# Patient Record
Sex: Female | Born: 1960 | Race: White | Hispanic: No | Marital: Married | Smoking: Never smoker
Health system: Southern US, Community
[De-identification: ages and names within clinical notes are randomized; demographics above are authoritative.]

## PROBLEM LIST (undated history)

## (undated) DIAGNOSIS — M7501 Adhesive capsulitis of right shoulder: Secondary | ICD-10-CM

## (undated) DIAGNOSIS — M1990 Unspecified osteoarthritis, unspecified site: Secondary | ICD-10-CM

## (undated) DIAGNOSIS — F329 Major depressive disorder, single episode, unspecified: Secondary | ICD-10-CM

## (undated) DIAGNOSIS — F32A Depression, unspecified: Secondary | ICD-10-CM

## (undated) HISTORY — PX: ABDOMINAL HYSTERECTOMY: SHX81

## (undated) HISTORY — PX: SHOULDER ARTHROSCOPY: SHX128

## (undated) HISTORY — PX: VOCAL CORD INJECTION: SHX2663

## (undated) HISTORY — PX: TONSILLECTOMY: SUR1361

---

## 2018-03-28 DIAGNOSIS — M25511 Pain in right shoulder: Secondary | ICD-10-CM | POA: Diagnosis not present

## 2018-03-30 DIAGNOSIS — M7501 Adhesive capsulitis of right shoulder: Secondary | ICD-10-CM | POA: Diagnosis not present

## 2018-04-02 DIAGNOSIS — M7501 Adhesive capsulitis of right shoulder: Secondary | ICD-10-CM | POA: Diagnosis not present

## 2018-04-06 DIAGNOSIS — M7501 Adhesive capsulitis of right shoulder: Secondary | ICD-10-CM | POA: Diagnosis not present

## 2018-04-10 DIAGNOSIS — M7501 Adhesive capsulitis of right shoulder: Secondary | ICD-10-CM | POA: Diagnosis not present

## 2018-04-12 DIAGNOSIS — M7501 Adhesive capsulitis of right shoulder: Secondary | ICD-10-CM | POA: Diagnosis not present

## 2018-04-16 DIAGNOSIS — M7501 Adhesive capsulitis of right shoulder: Secondary | ICD-10-CM | POA: Diagnosis not present

## 2018-04-19 DIAGNOSIS — M7501 Adhesive capsulitis of right shoulder: Secondary | ICD-10-CM | POA: Diagnosis not present

## 2018-04-23 DIAGNOSIS — M7501 Adhesive capsulitis of right shoulder: Secondary | ICD-10-CM | POA: Diagnosis not present

## 2018-04-25 DIAGNOSIS — M7501 Adhesive capsulitis of right shoulder: Secondary | ICD-10-CM | POA: Diagnosis not present

## 2018-04-30 ENCOUNTER — Other Ambulatory Visit: Payer: Self-pay | Admitting: Orthopaedic Surgery

## 2018-04-30 ENCOUNTER — Encounter (HOSPITAL_BASED_OUTPATIENT_CLINIC_OR_DEPARTMENT_OTHER): Payer: Self-pay | Admitting: *Deleted

## 2018-04-30 ENCOUNTER — Other Ambulatory Visit: Payer: Self-pay

## 2018-04-30 DIAGNOSIS — M25511 Pain in right shoulder: Secondary | ICD-10-CM | POA: Diagnosis not present

## 2018-05-03 NOTE — H&P (Signed)
Whitney Cruz is an 57 y.o. female.   Chief Complaint: Right shoulder pain HPI: Whitney Cruz persists with some terrible shoulder difficulty.  It is not as painful as it was but it is no less stiff.  She has been injected on 2 occasions.  She had a similar problem with the opposite shoulder which responded well to surgical intervention.  A lot of her pain is on the anterior aspect of the right shoulder.  She has trouble resting at night.   Imaging/Tests: I reviewed an MRI scan films and report of a study done on the right shoulder [place]/22/[place].  This shows some tendinosis and possibly a full thickness cuff tear.  There is also a split tear of the biceps tendon.  Past Medical History:  Diagnosis Date  . Adhesive capsulitis of right shoulder   . Arthritis    hands  . Depression    hormone related    Past Surgical History:  Procedure Laterality Date  . ABDOMINAL HYSTERECTOMY    . SHOULDER ARTHROSCOPY Left   . TONSILLECTOMY    . VOCAL CORD INJECTION      History reviewed. No pertinent family history. Social History:  reports that she has never smoked. She has never used smokeless tobacco. She reports that she drinks alcohol. She reports that she does not use drugs.  Allergies: No Known Allergies  No medications prior to admission.    No results found for this or any previous visit (from the past 48 hour(s)). No results found.  Review of Systems  Musculoskeletal:       Right shoulder pain  All other systems reviewed and are negative.   Height 5\' 3"  (1.6 m), weight 61.2 kg (135 lb). Physical Exam  Constitutional: She is oriented to person, place, and time. She appears well-developed and well-nourished.  HENT:  Head: Normocephalic and atraumatic.  Eyes: Pupils are equal, round, and reactive to light. Conjunctivae are normal.  Neck: Normal range of motion.  Cardiovascular: Normal rate and regular rhythm.  Respiratory: Effort normal.  GI: Soft.  Musculoskeletal:  Right  shoulder remains stiff at about 90 of forward flexion with external rotation to maybe 5 and internal rotation to her back pocket.  She has pain at extremes.  I do not get much pain to palpation through the bicipital groove.  Sensation and motor function are intact distally.  She has good cervical motion which causes no arm pain.   Neurological: She is alert and oriented to person, place, and time.  Skin: Skin is warm and dry.  Psychiatric: She has a normal mood and affect. Her behavior is normal. Judgment and thought content normal.    Assessment/Plan Assessment: Right shoulder adhesive capsulitis with MRI 2019 injected 2  Plan: Whitney Cruz is headed towards an arthroscopy with manipulation.  By her MRI scan she does have a split tear of the biceps and perhaps a small cuff tear and we need to be ready to address both of those but my hope is that neither will require anything of significance.  We will perform a conservative acromioplasty and release adhesions and do the manipulation.  If the biceps does look truly torn I would probably do a release.  I would have a fairly high threshold for fixing a small rotator cuff tear as this would interfere with her postoperative therapy and ability to perform immediate range of motion.  I reviewed risk of anesthesia and infection as well as the importance of postoperative therapy  with her in some detail.  [name], Ginger Organ, PA-C 05/03/2018, 3:44 PM

## 2018-05-04 ENCOUNTER — Ambulatory Visit (HOSPITAL_BASED_OUTPATIENT_CLINIC_OR_DEPARTMENT_OTHER): Payer: Federal, State, Local not specified - PPO | Admitting: Anesthesiology

## 2018-05-04 ENCOUNTER — Other Ambulatory Visit: Payer: Self-pay

## 2018-05-04 ENCOUNTER — Ambulatory Visit (HOSPITAL_BASED_OUTPATIENT_CLINIC_OR_DEPARTMENT_OTHER)
Admission: RE | Admit: 2018-05-04 | Discharge: 2018-05-04 | Disposition: A | Discharge status: Home / Self Care | Payer: Federal, State, Local not specified - PPO | Source: Ambulatory Visit | Attending: Orthopaedic Surgery | Admitting: Orthopaedic Surgery

## 2018-05-04 ENCOUNTER — Encounter (HOSPITAL_BASED_OUTPATIENT_CLINIC_OR_DEPARTMENT_OTHER): Payer: Self-pay | Admitting: *Deleted

## 2018-05-04 ENCOUNTER — Encounter (HOSPITAL_BASED_OUTPATIENT_CLINIC_OR_DEPARTMENT_OTHER)
Admission: RE | Disposition: A | Discharge status: Home / Self Care | Payer: Self-pay | Source: Ambulatory Visit | Attending: Orthopaedic Surgery

## 2018-05-04 DIAGNOSIS — M7541 Impingement syndrome of right shoulder: Secondary | ICD-10-CM | POA: Diagnosis not present

## 2018-05-04 DIAGNOSIS — M7501 Adhesive capsulitis of right shoulder: Secondary | ICD-10-CM | POA: Diagnosis not present

## 2018-05-04 DIAGNOSIS — M7551 Bursitis of right shoulder: Secondary | ICD-10-CM | POA: Insufficient documentation

## 2018-05-04 DIAGNOSIS — M19041 Primary osteoarthritis, right hand: Secondary | ICD-10-CM | POA: Insufficient documentation

## 2018-05-04 DIAGNOSIS — M19042 Primary osteoarthritis, left hand: Secondary | ICD-10-CM | POA: Diagnosis not present

## 2018-05-04 DIAGNOSIS — M75111 Incomplete rotator cuff tear or rupture of right shoulder, not specified as traumatic: Secondary | ICD-10-CM | POA: Insufficient documentation

## 2018-05-04 DIAGNOSIS — M75101 Unspecified rotator cuff tear or rupture of right shoulder, not specified as traumatic: Secondary | ICD-10-CM | POA: Diagnosis not present

## 2018-05-04 DIAGNOSIS — G8918 Other acute postprocedural pain: Secondary | ICD-10-CM | POA: Diagnosis not present

## 2018-05-04 HISTORY — PX: SHOULDER ARTHROSCOPY: SHX128

## 2018-05-04 HISTORY — DX: Adhesive capsulitis of right shoulder: M75.01

## 2018-05-04 HISTORY — DX: Depression, unspecified: F32.A

## 2018-05-04 HISTORY — DX: Unspecified osteoarthritis, unspecified site: M19.90

## 2018-05-04 HISTORY — DX: Major depressive disorder, single episode, unspecified: F32.9

## 2018-05-04 SURGERY — ARTHROSCOPY, SHOULDER
Anesthesia: Regional | Site: Shoulder | Laterality: Right

## 2018-05-04 MED ORDER — LIDOCAINE HCL (CARDIAC) PF 100 MG/5ML IV SOSY
PREFILLED_SYRINGE | INTRAVENOUS | Status: AC
  Filled 2018-05-04: qty 5

## 2018-05-04 MED ORDER — LACTATED RINGERS IV SOLN
INTRAVENOUS | Status: DC
  Administered 2018-05-04: 11:00:00 via INTRAVENOUS

## 2018-05-04 MED ORDER — DEXAMETHASONE SODIUM PHOSPHATE 10 MG/ML IJ SOLN
INTRAMUSCULAR | Status: AC
  Filled 2018-05-04: qty 1

## 2018-05-04 MED ORDER — MIDAZOLAM HCL 2 MG/2ML IJ SOLN
1.0000 mg | INTRAMUSCULAR | Status: DC | PRN
  Administered 2018-05-04: 2 mg via INTRAVENOUS

## 2018-05-04 MED ORDER — DEXAMETHASONE SODIUM PHOSPHATE 10 MG/ML IJ SOLN
INTRAMUSCULAR | Status: DC | PRN
  Administered 2018-05-04: 10 mg via INTRAVENOUS

## 2018-05-04 MED ORDER — SODIUM CHLORIDE 0.9 % IR SOLN
Status: DC | PRN
  Administered 2018-05-04: 6000 mL

## 2018-05-04 MED ORDER — LACTATED RINGERS IV SOLN: INTRAVENOUS | Status: DC

## 2018-05-04 MED ORDER — SCOPOLAMINE 1 MG/3DAYS TD PT72: 1.0000 | MEDICATED_PATCH | Freq: Once | TRANSDERMAL | Status: DC | PRN

## 2018-05-04 MED ORDER — PHENYLEPHRINE 40 MCG/ML (10ML) SYRINGE FOR IV PUSH (FOR BLOOD PRESSURE SUPPORT)
PREFILLED_SYRINGE | INTRAVENOUS | Status: DC | PRN
  Administered 2018-05-04: 80 ug via INTRAVENOUS
  Administered 2018-05-04: 120 ug via INTRAVENOUS
  Administered 2018-05-04 (×3): 80 ug via INTRAVENOUS
  Administered 2018-05-04: 40 ug via INTRAVENOUS
  Administered 2018-05-04 (×2): 80 ug via INTRAVENOUS

## 2018-05-04 MED ORDER — MIDAZOLAM HCL 2 MG/2ML IJ SOLN
INTRAMUSCULAR | Status: AC
  Filled 2018-05-04: qty 2

## 2018-05-04 MED ORDER — EPHEDRINE SULFATE 50 MG/ML IJ SOLN
INTRAMUSCULAR | Status: AC
  Filled 2018-05-04: qty 1

## 2018-05-04 MED ORDER — PROPOFOL 10 MG/ML IV BOLUS
INTRAVENOUS | Status: AC
  Filled 2018-05-04: qty 20

## 2018-05-04 MED ORDER — LIDOCAINE 2% (20 MG/ML) 5 ML SYRINGE
INTRAMUSCULAR | Status: DC | PRN
  Administered 2018-05-04: 60 mg via INTRAVENOUS

## 2018-05-04 MED ORDER — CEFAZOLIN SODIUM-DEXTROSE 2-4 GM/100ML-% IV SOLN
INTRAVENOUS | Status: AC
  Filled 2018-05-04: qty 100

## 2018-05-04 MED ORDER — ONDANSETRON HCL 4 MG/2ML IJ SOLN
INTRAMUSCULAR | Status: DC | PRN
  Administered 2018-05-04: 4 mg via INTRAVENOUS

## 2018-05-04 MED ORDER — FENTANYL CITRATE (PF) 100 MCG/2ML IJ SOLN
INTRAMUSCULAR | Status: AC
  Filled 2018-05-04: qty 2

## 2018-05-04 MED ORDER — FENTANYL CITRATE (PF) 100 MCG/2ML IJ SOLN: 25.0000 ug | INTRAMUSCULAR | Status: DC | PRN

## 2018-05-04 MED ORDER — ONDANSETRON HCL 4 MG/2ML IJ SOLN: 4.0000 mg | Freq: Once | INTRAMUSCULAR | Status: DC | PRN

## 2018-05-04 MED ORDER — FENTANYL CITRATE (PF) 100 MCG/2ML IJ SOLN: 50.0000 ug | INTRAMUSCULAR | Status: DC | PRN

## 2018-05-04 MED ORDER — EPHEDRINE SULFATE-NACL 50-0.9 MG/10ML-% IV SOSY
PREFILLED_SYRINGE | INTRAVENOUS | Status: DC | PRN
  Administered 2018-05-04 (×4): 5 mg via INTRAVENOUS

## 2018-05-04 MED ORDER — ROCURONIUM BROMIDE 100 MG/10ML IV SOLN
INTRAVENOUS | Status: DC | PRN
  Administered 2018-05-04: 30 mg via INTRAVENOUS

## 2018-05-04 MED ORDER — CHLORHEXIDINE GLUCONATE 4 % EX LIQD: 60.0000 mL | Freq: Once | CUTANEOUS | Status: DC

## 2018-05-04 MED ORDER — LIDOCAINE HCL (CARDIAC) PF 100 MG/5ML IV SOSY
PREFILLED_SYRINGE | INTRAVENOUS | Status: AC
  Filled 2018-05-04: qty 10

## 2018-05-04 MED ORDER — FENTANYL CITRATE (PF) 100 MCG/2ML IJ SOLN
INTRAMUSCULAR | Status: DC | PRN
  Administered 2018-05-04: 50 ug via INTRAVENOUS

## 2018-05-04 MED ORDER — PROPOFOL 10 MG/ML IV BOLUS
INTRAVENOUS | Status: DC | PRN
  Administered 2018-05-04: 150 mg via INTRAVENOUS

## 2018-05-04 MED ORDER — SUGAMMADEX SODIUM 200 MG/2ML IV SOLN
INTRAVENOUS | Status: DC | PRN
  Administered 2018-05-04: 150 mg via INTRAVENOUS

## 2018-05-04 MED ORDER — SODIUM CHLORIDE 0.9 % IJ SOLN
INTRAMUSCULAR | Status: AC
  Filled 2018-05-04: qty 10

## 2018-05-04 MED ORDER — ROPIVACAINE HCL 7.5 MG/ML IJ SOLN
INTRAMUSCULAR | Status: DC | PRN
  Administered 2018-05-04: 20 mL via PERINEURAL

## 2018-05-04 MED ORDER — ONDANSETRON HCL 4 MG/2ML IJ SOLN
INTRAMUSCULAR | Status: AC
  Filled 2018-05-04: qty 2

## 2018-05-04 MED ORDER — CEFAZOLIN SODIUM-DEXTROSE 2-4 GM/100ML-% IV SOLN
2.0000 g | INTRAVENOUS | Status: AC
  Administered 2018-05-04: 2 g via INTRAVENOUS

## 2018-05-04 MED ORDER — HYDROCODONE-ACETAMINOPHEN 5-325 MG PO TABS: 1.0000 | ORAL_TABLET | Freq: Four times a day (QID) | ORAL | 0 refills | Status: AC | PRN

## 2018-05-04 SURGICAL SUPPLY — 65 items
BENZOIN TINCTURE PRP APPL 2/3 (GAUZE/BANDAGES/DRESSINGS) IMPLANT
BLADE CUDA 5.5 (BLADE) IMPLANT
BLADE GREAT WHITE 4.2 (BLADE) ×2 IMPLANT
BLADE SURG 15 STRL LF DISP TIS (BLADE) IMPLANT
BLADE SURG 15 STRL SS (BLADE)
BUR VERTEX HOODED 4.5 (BURR) ×2 IMPLANT
CANNULA SHOULDER 7CM (CANNULA) ×2 IMPLANT
CANNULA TWIST IN 8.25X7CM (CANNULA) IMPLANT
DECANTER SPIKE VIAL GLASS SM (MISCELLANEOUS) IMPLANT
DERMABOND ADVANCED (GAUZE/BANDAGES/DRESSINGS)
DERMABOND ADVANCED .7 DNX12 (GAUZE/BANDAGES/DRESSINGS) IMPLANT
DRAPE STERI 35X30 U-POUCH (DRAPES) ×2 IMPLANT
DRAPE U-SHAPE 47X51 STRL (DRAPES) ×2 IMPLANT
DRAPE U-SHAPE 76X120 STRL (DRAPES) ×4 IMPLANT
DRSG EMULSION OIL 3X3 NADH (GAUZE/BANDAGES/DRESSINGS) ×4 IMPLANT
DRSG PAD ABDOMINAL 8X10 ST (GAUZE/BANDAGES/DRESSINGS) ×4 IMPLANT
DURAPREP 26ML APPLICATOR (WOUND CARE) ×2 IMPLANT
ELECT MENISCUS 165MM 90D (ELECTRODE) IMPLANT
ELECT REM PT RETURN 9FT ADLT (ELECTROSURGICAL) ×2
ELECTRODE REM PT RTRN 9FT ADLT (ELECTROSURGICAL) ×1 IMPLANT
GAUZE SPONGE 4X4 12PLY STRL (GAUZE/BANDAGES/DRESSINGS) ×2 IMPLANT
GAUZE SPONGE 4X4 12PLY STRL LF (GAUZE/BANDAGES/DRESSINGS) ×2 IMPLANT
GLOVE BIO SURGEON STRL SZ8 (GLOVE) ×4 IMPLANT
GLOVE BIOGEL PI IND STRL 8 (GLOVE) ×2 IMPLANT
GLOVE BIOGEL PI INDICATOR 8 (GLOVE) ×2
GOWN STRL REUS W/ TWL LRG LVL3 (GOWN DISPOSABLE) ×2 IMPLANT
GOWN STRL REUS W/ TWL XL LVL3 (GOWN DISPOSABLE) ×2 IMPLANT
GOWN STRL REUS W/TWL LRG LVL3 (GOWN DISPOSABLE) ×2
GOWN STRL REUS W/TWL XL LVL3 (GOWN DISPOSABLE) ×2
MANIFOLD NEPTUNE II (INSTRUMENTS) ×2 IMPLANT
NDL SUT 6 .5 CRC .975X.05 MAYO (NEEDLE) IMPLANT
NEEDLE MAYO TAPER (NEEDLE)
NEEDLE SCORPION MULTI FIRE (NEEDLE) IMPLANT
NS IRRIG 1000ML POUR BTL (IV SOLUTION) IMPLANT
PACK ARTHROSCOPY DSU (CUSTOM PROCEDURE TRAY) ×2 IMPLANT
PACK BASIN DAY SURGERY FS (CUSTOM PROCEDURE TRAY) ×2 IMPLANT
PASSER SUT SWANSON 36MM LOOP (INSTRUMENTS) IMPLANT
PENCIL BUTTON HOLSTER BLD 10FT (ELECTRODE) IMPLANT
PROBE BIPOLAR ATHRO 135MM 90D (MISCELLANEOUS) ×4 IMPLANT
RESTRAINT HEAD UNIVERSAL NS (MISCELLANEOUS) ×2 IMPLANT
SHEET MEDIUM DRAPE 40X70 STRL (DRAPES) ×2 IMPLANT
SLEEVE SCD COMPRESS KNEE MED (MISCELLANEOUS) ×2 IMPLANT
SLING ARM FOAM STRAP LRG (SOFTGOODS) IMPLANT
SLING ARM MED ADULT FOAM STRAP (SOFTGOODS) IMPLANT
SLING ARM SM FOAM STRAP (SOFTGOODS) IMPLANT
SLING ARM XL FOAM STRAP (SOFTGOODS) IMPLANT
SPONGE LAP 4X18 RFD (DISPOSABLE) IMPLANT
STRIP CLOSURE SKIN 1/2X4 (GAUZE/BANDAGES/DRESSINGS) IMPLANT
SUCTION FRAZIER HANDLE 10FR (MISCELLANEOUS)
SUCTION TUBE FRAZIER 10FR DISP (MISCELLANEOUS) IMPLANT
SUT ETHIBOND 2 OS 4 DA (SUTURE) IMPLANT
SUT ETHILON 3 0 PS 1 (SUTURE) ×2 IMPLANT
SUT FIBERWIRE #2 38 T-5 BLUE (SUTURE)
SUT PDS AB 2-0 CT2 27 (SUTURE) IMPLANT
SUT VIC AB 0 SH 27 (SUTURE) IMPLANT
SUT VIC AB 2-0 SH 27 (SUTURE)
SUT VIC AB 2-0 SH 27XBRD (SUTURE) IMPLANT
SUT VICRYL 4-0 PS2 18IN ABS (SUTURE) IMPLANT
SUTURE FIBERWR #2 38 T-5 BLUE (SUTURE) IMPLANT
SYR BULB 3OZ (MISCELLANEOUS) IMPLANT
TOWEL GREEN STERILE FF (TOWEL DISPOSABLE) ×2 IMPLANT
TOWEL OR NON WOVEN STRL DISP B (DISPOSABLE) ×2 IMPLANT
TUBING ARTHRO INFLOW-ONLY STRL (TUBING) ×2 IMPLANT
WATER STERILE IRR 1000ML POUR (IV SOLUTION) ×2 IMPLANT
YANKAUER SUCT BULB TIP NO VENT (SUCTIONS) IMPLANT

## 2018-05-04 NOTE — Anesthesia Postprocedure Evaluation (Signed)
Anesthesia Post Note  Patient: Vernard Gamblesllen S Pring  Procedure(s) Performed: ARTHROSCOPY SHOULDER (Right Shoulder)     Patient location during evaluation: PACU Anesthesia Type: Regional and General Level of consciousness: awake and alert Pain management: pain level controlled Vital Signs Assessment: post-procedure vital signs reviewed and stable Respiratory status: spontaneous breathing, nonlabored ventilation, respiratory function stable and patient connected to nasal cannula oxygen Cardiovascular status: blood pressure returned to baseline and stable Postop Assessment: no apparent nausea or vomiting Anesthetic complications: no    Last Vitals:  Vitals:   05/04/18 1415 05/04/18 1515  BP: 108/69 110/64  Pulse: 96 75  Resp: (!) 21 18  Temp:  36.6 C  SpO2: 100% 100%    Last Pain:  Vitals:   05/04/18 1515  TempSrc:   PainSc: 0-No pain                 Cumi Sanagustin P Elke Holtry

## 2018-05-04 NOTE — Anesthesia Procedure Notes (Signed)
Anesthesia Regional Block: Interscalene brachial plexus block   Pre-Anesthetic Checklist: ,, timeout performed, Correct Patient, Correct Site, Correct Laterality, Correct Procedure,, site marked, risks and benefits discussed, Surgical consent,  Pre-op evaluation,  At surgeon's request and post-op pain management  Laterality: Right  Prep: chloraprep       Needles:  Injection technique: Single-shot  Needle Type: Echogenic Stimulator Needle     Needle Length: 9cm  Needle Gauge: 21     Additional Needles:   Procedures:,,,, ultrasound used (permanent image in chart),,,,  Narrative:  Start time: 05/04/2018 12:00 PM End time: 05/04/2018 12:10 PM Injection made incrementally with aspirations every 5 mL.  Performed by: Personally  Anesthesiologist: Leonides GrillsEllender, Mila Pair P, MD  Additional Notes: Functioning IV was confirmed and monitors were applied.  A 90mm 21ga Arrow echogenic stimulator needle was used. Sterile prep, hand hygiene and sterile gloves were used.  Negative aspiration and negative test dose prior to incremental administration of local anesthetic. The patient tolerated the procedure well.

## 2018-05-04 NOTE — Interval H&P Note (Signed)
History and Physical Interval Note:  05/04/2018 12:42 PM  Whitney GamblesEllen S Cott  has presented today for surgery, with the diagnosis of RIGHT SHOULDER ADHESIVE CAPSULITIS AND IMPINGEMENT  The various methods of treatment have been discussed with the patient and family. After consideration of risks, benefits and other options for treatment, the patient has consented to  Procedure(s): ARTHROSCOPY SHOULDER (Right) as a surgical intervention .  The patient's history has been reviewed, patient examined, no change in status, stable for surgery.  I have reviewed the patient's chart and labs.  Questions were answered to the patient's satisfaction.     Deniese Oberry G

## 2018-05-04 NOTE — Progress Notes (Signed)
Assisted Dr. Ellender with right, ultrasound guided, interscalene  block. Side rails up, monitors on throughout procedure. See vital signs in flow sheet. Tolerated Procedure well. 

## 2018-05-04 NOTE — Op Note (Signed)
NAME: Whitney Cruz, Whitney S. MEDICAL RECORD [id number] ACCOUNT [id number] DATE OF BIRTH:05-28-1961 FACILITY: [facility] LOCATION: [facility] PHYSICIAN:Eira Alpert Janann ColonelG. Jacoba Cherney, MD  OPERATIVE REPORT  DATE OF PROCEDURE:  05/04/2018  PREOPERATIVE DIAGNOSES: 1.  Right shoulder adhesive capsulitis. 2.  Right shoulder impingement. 3.  Right shoulder rotator cuff tear.  POSTOPERATIVE DIAGNOSES: 1.  Right shoulder adhesive capsulitis. 2.  Right shoulder impingement. 3.  Right shoulder rotator cuff tear.  PROCEDURES: 1.  Right shoulder manipulation. 2.  Right shoulder arthroscopic debridement. 3.  Right shoulder arthroscopic acromioplasty.  ANESTHESIA:  General and block.  ATTENDING SURGEON:  Marcene CorningPeter Crystel Demarco, MD   ASSISTANT:  Elodia FlorenceAndrew Nida, PA-C.  INDICATIONS:  The patient is a 57 year old woman with many months of right shoulder pain and stiffness.  She has been injected on 2 occasions.  She has been through some physical therapy.  By MRI scan, she has a partial versus full thickness tear of the  rotator cuff and a longitudinal tear of the biceps along with adhesive capsulitis.  She has pain which limits her ability to use her arm and to rest.  At this point, she is quite frustrated and she was offered arthroscopic intervention.  Informed  operative consent was obtained after discussion of possible complications including reaction to anesthesia and infection.  SUMMARY OF FINDINGS AND PROCEDURE:  Under general anesthesia and a block, a right shoulder arthroscopy was performed.  Prior to the arthroscopy, I performed a manipulation and improved her motion from 80 degrees of forward flexion to 150 and in terms of  external rotation from about neutral to 40 degrees with some audible pops along the way.  We then placed the scope.  She had no degenerative change in the glenohumeral joint.  The biceps tendon appeared normal and I pulled it down into the joint and  still, it was normal in the intraarticular  portion.  She did have an undersurface partial tear of the rotator cuff in the supraspinatus portion and a debridement was done, but this certainly was not full thickness.  In the subacromial space, she had a  great deal of bursitis and adhesions, all of which were released.  We then performed a conservative acromioplasty.  She was scheduled to go home the same day.  DESCRIPTION OF PROCEDURE:  The patient was taken to the operating suite where general anesthetic was applied without difficulty.  She was also given a block in the preanesthesia area.  She was positioned in beach chair position and prepped and draped in  normal sterile fashion.  After the administration of preoperative IV Kefzol and appropriate timeout, the manipulation was performed as described above.  We then performed the arthroscopy through a total of 3 portals.  Findings were as noted above and  procedure consisted predominantly of the debridement of the undersurface partial cuff tear in the supraspinatus portion followed by lysis of some additional adhesions around the subscapularis and in the subacromial space.  The shoulder was examined  thoroughly on the bursal aspect of the cuff and no tear worthy repair was found.  The shoulder was then irrigated, followed by removal of arthroscopic equipment.  We reapproximated the portals loosely with nylon followed by Adaptic, dry gauze and tape.  ESTIMATED BLOOD LOSS:    No fluid was obtained from anesthesia records.  DISPOSITION:  The patient was extubated in the operating room and taken to recovery room in stable condition.  Plans were for her to go home the same day and to follow up in the  office in less than a week.  I will contact her by phone tonight.  AN/NUANCE  D:05/04/2018 T:05/04/2018 JOB:[id number]

## 2018-05-04 NOTE — Op Note (Signed)
Whitney Cruz [id number] 05/04/2018   PRE-OP DIAGNOSIS: right sh imp and adhesive capsulitis  POST-OP DIAGNOSIS: same  PROCEDURE: right sh manip and scope  ANESTHESIA: general and block  Myka Lukins G   Dictation #:  [id number]

## 2018-05-04 NOTE — Discharge Instructions (Signed)
Regional Anesthesia Blocks ? ?1. Numbness or the inability to move the "blocked" extremity may last from 3-48 hours after placement. The length of time depends on the medication injected and your individual response to the medication. If the numbness is not going away after 48 hours, call your surgeon. ? ?2. The extremity that is blocked will need to be protected until the numbness is gone and the  Strength has returned. Because you cannot feel it, you will need to take extra care to avoid injury. Because it may be weak, you may have difficulty moving it or using it. You may not know what position it is in without looking at it while the block is in effect. ? ?3. For blocks in the legs and feet, returning to weight bearing and walking needs to be done carefully. You will need to wait until the numbness is entirely gone and the strength has returned. You should be able to move your leg and foot normally before you try and bear weight or walk. You will need someone to be with you when you first try to ensure you do not fall and possibly risk injury. ? ?4. Bruising and tenderness at the needle site are common side effects and will resolve in a few days. ? ?5. Persistent numbness or new problems with movement should be communicated to the surgeon or the [facility] ([phone])/ [facility] ([phone]).  ? ?Post Anesthesia Home Care Instructions ? ?Activity: ?Get plenty of rest for the remainder of the day. A responsible individual must stay with you for 24 hours following the procedure.  ?For the next 24 hours, DO NOT: ?-Drive a car ?-Operate machinery ?-Drink alcoholic beverages ?-Take any medication unless instructed by your physician ?-Make any legal decisions or sign important papers. ? ?Meals: ?Start with liquid foods such as gelatin or soup. Progress to regular foods as tolerated. Avoid greasy, spicy, heavy foods. If nausea and/or vomiting occur, drink only clear liquids until the  nausea and/or vomiting subsides. Call your physician if vomiting continues. ? ?Special Instructions/Symptoms: ?Your throat may feel dry or sore from the anesthesia or the breathing tube placed in your throat during surgery. If this causes discomfort, gargle with warm salt water. The discomfort should disappear within 24 hours. ? ?If you had a scopolamine patch placed behind your ear for the management of post- operative nausea and/or vomiting: ? ?1. The medication in the patch is effective for 72 hours, after which it should be removed.  Wrap patch in a tissue and discard in the trash. Wash hands thoroughly with soap and water. ?2. You may remove the patch earlier than 72 hours if you experience unpleasant side effects which may include dry mouth, dizziness or visual disturbances. ?3. Avoid touching the patch. Wash your hands with soap and water after contact with the patch. ?    ?

## 2018-05-04 NOTE — Transfer of Care (Signed)
Immediate Anesthesia Transfer of Care Note  Patient: Whitney Cruz  Procedure(s) Performed: ARTHROSCOPY SHOULDER (Right Shoulder)  Patient Location: PACU  Anesthesia Type:General  Level of Consciousness: awake, alert  and oriented  Airway & Oxygen Therapy: Patient Spontanous Breathing  Post-op Assessment: Report given to RN and Post -op Vital signs reviewed and stable  Post vital signs: Reviewed and stable  Last Vitals:  Vitals Value Taken Time  BP 117/83 05/04/2018  2:00 PM  Temp    Pulse 105 05/04/2018  2:00 PM  Resp 19 05/04/2018  2:00 PM  SpO2 99 % 05/04/2018  2:00 PM  Vitals shown include unvalidated device data.  Last Pain:  Vitals:   05/04/18 1025  TempSrc: Oral  PainSc: 0-No pain      Patients Stated Pain Goal: 0 (05/04/18 1025)  Complications: No apparent anesthesia complications

## 2018-05-04 NOTE — Anesthesia Procedure Notes (Signed)
Procedure Name: Intubation Date/Time: 05/04/2018 1:06 PM Performed by: Marny Lowensteinapozzi, Anaiya Wisinski W, CRNA Pre-anesthesia Checklist: Patient identified, Emergency Drugs available, Suction available, Patient being monitored and Timeout performed Patient Re-evaluated:Patient Re-evaluated prior to induction Oxygen Delivery Method: Circle system utilized Preoxygenation: Pre-oxygenation with 100% oxygen Induction Type: IV induction Ventilation: Mask ventilation without difficulty Laryngoscope Size: Miller and 2 Grade View: Grade I Tube type: Oral Tube size: 7.0 mm Number of attempts: 1 Placement Confirmation: ETT inserted through vocal cords under direct vision,  positive ETCO2,  CO2 detector and breath sounds checked- equal and bilateral Secured at: 20 cm Tube secured with: Tape Dental Injury: Teeth and Oropharynx as per pre-operative assessment

## 2018-05-04 NOTE — Anesthesia Preprocedure Evaluation (Addendum)
Anesthesia Evaluation  Patient identified by MRN, date of birth, ID band Patient awake    Reviewed: Allergy & Precautions, NPO status , Patient's Chart, lab work & pertinent test results  Airway Mallampati: II  TM Distance: >3 FB Neck ROM: Full    Dental no notable dental hx.    Pulmonary neg pulmonary ROS,    Pulmonary exam normal breath sounds clear to auscultation       Cardiovascular negative cardio ROS Normal cardiovascular exam Rhythm:Regular Rate:Normal     Neuro/Psych PSYCHIATRIC DISORDERS Depression negative neurological ROS     GI/Hepatic negative GI ROS, Neg liver ROS,   Endo/Other  negative endocrine ROS  Renal/GU negative Renal ROS     Musculoskeletal negative musculoskeletal ROS (+)   Abdominal   Peds  Hematology negative hematology ROS (+)   Anesthesia Other Findings RIGHT SHOULDER ADHESIVE CAPSULITIS AND IMPINGEMENT  Reproductive/Obstetrics                            Anesthesia Physical Anesthesia Plan  ASA: I  Anesthesia Plan: General and Regional   Post-op Pain Management: GA combined w/ Regional for post-op pain   Induction: Intravenous  PONV Risk Score and Plan: 3 and Dexamethasone, Ondansetron and Midazolam  Airway Management Planned: Oral ETT  Additional Equipment:   Intra-op Plan:   Post-operative Plan: Extubation in OR  Informed Consent: I have reviewed the patients History and Physical, chart, labs and discussed the procedure including the risks, benefits and alternatives for the proposed anesthesia with the patient or authorized representative who has indicated his/her understanding and acceptance.   Dental advisory given  Plan Discussed with: CRNA  Anesthesia Plan Comments:       Anesthesia Quick Evaluation

## 2018-05-07 ENCOUNTER — Encounter (HOSPITAL_BASED_OUTPATIENT_CLINIC_OR_DEPARTMENT_OTHER): Payer: Self-pay | Admitting: Orthopaedic Surgery

## 2018-05-08 DIAGNOSIS — M7501 Adhesive capsulitis of right shoulder: Secondary | ICD-10-CM | POA: Diagnosis not present

## 2018-05-08 DIAGNOSIS — M25511 Pain in right shoulder: Secondary | ICD-10-CM | POA: Diagnosis not present

## 2018-05-10 DIAGNOSIS — M25511 Pain in right shoulder: Secondary | ICD-10-CM | POA: Diagnosis not present

## 2018-05-10 DIAGNOSIS — M7501 Adhesive capsulitis of right shoulder: Secondary | ICD-10-CM | POA: Diagnosis not present

## 2018-05-11 DIAGNOSIS — M25511 Pain in right shoulder: Secondary | ICD-10-CM | POA: Diagnosis not present

## 2018-05-11 DIAGNOSIS — M7501 Adhesive capsulitis of right shoulder: Secondary | ICD-10-CM | POA: Diagnosis not present

## 2018-05-15 DIAGNOSIS — M25511 Pain in right shoulder: Secondary | ICD-10-CM | POA: Diagnosis not present

## 2018-05-15 DIAGNOSIS — M7501 Adhesive capsulitis of right shoulder: Secondary | ICD-10-CM | POA: Diagnosis not present

## 2018-05-16 DIAGNOSIS — M7501 Adhesive capsulitis of right shoulder: Secondary | ICD-10-CM | POA: Diagnosis not present

## 2018-05-16 DIAGNOSIS — M25511 Pain in right shoulder: Secondary | ICD-10-CM | POA: Diagnosis not present

## 2018-05-18 DIAGNOSIS — M25511 Pain in right shoulder: Secondary | ICD-10-CM | POA: Diagnosis not present

## 2018-05-18 DIAGNOSIS — M7501 Adhesive capsulitis of right shoulder: Secondary | ICD-10-CM | POA: Diagnosis not present

## 2018-05-21 DIAGNOSIS — M25511 Pain in right shoulder: Secondary | ICD-10-CM | POA: Diagnosis not present

## 2018-05-21 DIAGNOSIS — M7501 Adhesive capsulitis of right shoulder: Secondary | ICD-10-CM | POA: Diagnosis not present

## 2018-05-25 DIAGNOSIS — M7501 Adhesive capsulitis of right shoulder: Secondary | ICD-10-CM | POA: Diagnosis not present

## 2018-05-25 DIAGNOSIS — M25511 Pain in right shoulder: Secondary | ICD-10-CM | POA: Diagnosis not present

## 2018-05-28 DIAGNOSIS — M25511 Pain in right shoulder: Secondary | ICD-10-CM | POA: Diagnosis not present

## 2018-05-28 DIAGNOSIS — M7501 Adhesive capsulitis of right shoulder: Secondary | ICD-10-CM | POA: Diagnosis not present

## 2018-05-30 DIAGNOSIS — M7501 Adhesive capsulitis of right shoulder: Secondary | ICD-10-CM | POA: Diagnosis not present

## 2018-05-30 DIAGNOSIS — M25511 Pain in right shoulder: Secondary | ICD-10-CM | POA: Diagnosis not present

## 2018-06-07 DIAGNOSIS — M25511 Pain in right shoulder: Secondary | ICD-10-CM | POA: Diagnosis not present

## 2018-06-07 DIAGNOSIS — M7501 Adhesive capsulitis of right shoulder: Secondary | ICD-10-CM | POA: Diagnosis not present

## 2018-06-12 DIAGNOSIS — K080 Exfoliation of teeth due to systemic causes: Secondary | ICD-10-CM | POA: Diagnosis not present

## 2018-06-13 DIAGNOSIS — M7501 Adhesive capsulitis of right shoulder: Secondary | ICD-10-CM | POA: Diagnosis not present

## 2018-06-13 DIAGNOSIS — M25511 Pain in right shoulder: Secondary | ICD-10-CM | POA: Diagnosis not present

## 2018-06-20 DIAGNOSIS — M7501 Adhesive capsulitis of right shoulder: Secondary | ICD-10-CM | POA: Diagnosis not present

## 2018-06-20 DIAGNOSIS — M25511 Pain in right shoulder: Secondary | ICD-10-CM | POA: Diagnosis not present

## 2018-06-27 DIAGNOSIS — M7501 Adhesive capsulitis of right shoulder: Secondary | ICD-10-CM | POA: Diagnosis not present

## 2018-06-27 DIAGNOSIS — M25511 Pain in right shoulder: Secondary | ICD-10-CM | POA: Diagnosis not present

## 2018-07-02 DIAGNOSIS — K080 Exfoliation of teeth due to systemic causes: Secondary | ICD-10-CM | POA: Diagnosis not present

## 2018-07-04 DIAGNOSIS — M7501 Adhesive capsulitis of right shoulder: Secondary | ICD-10-CM | POA: Diagnosis not present

## 2018-07-04 DIAGNOSIS — M25511 Pain in right shoulder: Secondary | ICD-10-CM | POA: Diagnosis not present

## 2018-07-18 DIAGNOSIS — M25511 Pain in right shoulder: Secondary | ICD-10-CM | POA: Diagnosis not present

## 2018-07-18 DIAGNOSIS — M7501 Adhesive capsulitis of right shoulder: Secondary | ICD-10-CM | POA: Diagnosis not present

## 2018-07-25 DIAGNOSIS — Z9889 Other specified postprocedural states: Secondary | ICD-10-CM | POA: Diagnosis not present

## 2018-09-07 DIAGNOSIS — L570 Actinic keratosis: Secondary | ICD-10-CM | POA: Diagnosis not present

## 2018-09-07 DIAGNOSIS — D485 Neoplasm of uncertain behavior of skin: Secondary | ICD-10-CM | POA: Diagnosis not present

## 2018-09-07 DIAGNOSIS — L821 Other seborrheic keratosis: Secondary | ICD-10-CM | POA: Diagnosis not present

## 2018-09-07 DIAGNOSIS — L814 Other melanin hyperpigmentation: Secondary | ICD-10-CM | POA: Diagnosis not present

## 2018-09-24 DIAGNOSIS — M25511 Pain in right shoulder: Secondary | ICD-10-CM | POA: Diagnosis not present

## 2018-10-09 DIAGNOSIS — G4700 Insomnia, unspecified: Secondary | ICD-10-CM | POA: Diagnosis not present

## 2018-10-09 DIAGNOSIS — R0683 Snoring: Secondary | ICD-10-CM | POA: Diagnosis not present

## 2018-10-09 DIAGNOSIS — E559 Vitamin D deficiency, unspecified: Secondary | ICD-10-CM | POA: Diagnosis not present

## 2018-10-09 DIAGNOSIS — Z862 Personal history of diseases of the blood and blood-forming organs and certain disorders involving the immune mechanism: Secondary | ICD-10-CM | POA: Diagnosis not present

## 2018-10-09 DIAGNOSIS — Z0000 Encounter for general adult medical examination without abnormal findings: Secondary | ICD-10-CM | POA: Diagnosis not present

## 2018-10-11 ENCOUNTER — Other Ambulatory Visit: Payer: Self-pay | Admitting: Physician Assistant

## 2018-10-11 DIAGNOSIS — Z1231 Encounter for screening mammogram for malignant neoplasm of breast: Secondary | ICD-10-CM

## 2018-10-11 DIAGNOSIS — Z13820 Encounter for screening for osteoporosis: Secondary | ICD-10-CM

## 2018-12-27 DIAGNOSIS — J40 Bronchitis, not specified as acute or chronic: Secondary | ICD-10-CM | POA: Diagnosis not present

## 2019-01-04 DIAGNOSIS — L821 Other seborrheic keratosis: Secondary | ICD-10-CM | POA: Diagnosis not present

## 2019-01-04 DIAGNOSIS — D485 Neoplasm of uncertain behavior of skin: Secondary | ICD-10-CM | POA: Diagnosis not present

## 2019-01-04 DIAGNOSIS — L814 Other melanin hyperpigmentation: Secondary | ICD-10-CM | POA: Diagnosis not present

## 2019-01-04 DIAGNOSIS — C44629 Squamous cell carcinoma of skin of left upper limb, including shoulder: Secondary | ICD-10-CM | POA: Diagnosis not present

## 2019-01-04 DIAGNOSIS — L570 Actinic keratosis: Secondary | ICD-10-CM | POA: Diagnosis not present

## 2019-01-11 ENCOUNTER — Inpatient Hospital Stay: Admission: RE | Admit: 2019-01-11 | Payer: Federal, State, Local not specified - PPO | Source: Ambulatory Visit

## 2019-01-11 ENCOUNTER — Ambulatory Visit: Payer: Federal, State, Local not specified - PPO

## 2019-01-17 DIAGNOSIS — C44629 Squamous cell carcinoma of skin of left upper limb, including shoulder: Secondary | ICD-10-CM | POA: Diagnosis not present

## 2019-01-17 DIAGNOSIS — L905 Scar conditions and fibrosis of skin: Secondary | ICD-10-CM | POA: Diagnosis not present

## 2019-03-20 ENCOUNTER — Ambulatory Visit: Payer: Federal, State, Local not specified - PPO

## 2019-03-20 ENCOUNTER — Other Ambulatory Visit: Payer: Federal, State, Local not specified - PPO

## 2019-04-04 DIAGNOSIS — Z130 Encounter for screening for diseases of the blood and blood-forming organs and certain disorders involving the immune mechanism: Secondary | ICD-10-CM | POA: Diagnosis not present

## 2019-04-04 DIAGNOSIS — Z1389 Encounter for screening for other disorder: Secondary | ICD-10-CM | POA: Diagnosis not present

## 2019-04-04 DIAGNOSIS — Z6824 Body mass index (BMI) 24.0-24.9, adult: Secondary | ICD-10-CM | POA: Diagnosis not present

## 2019-04-04 DIAGNOSIS — Z01419 Encounter for gynecological examination (general) (routine) without abnormal findings: Secondary | ICD-10-CM | POA: Diagnosis not present

## 2019-04-11 DIAGNOSIS — E7800 Pure hypercholesterolemia, unspecified: Secondary | ICD-10-CM | POA: Diagnosis not present

## 2019-05-08 ENCOUNTER — Other Ambulatory Visit: Payer: Self-pay

## 2019-05-08 ENCOUNTER — Ambulatory Visit
Admission: RE | Admit: 2019-05-08 | Discharge: 2019-05-08 | Disposition: A | Discharge status: Home / Self Care | Payer: Federal, State, Local not specified - PPO | Source: Ambulatory Visit | Attending: Physician Assistant | Admitting: Physician Assistant

## 2019-05-08 DIAGNOSIS — M810 Age-related osteoporosis without current pathological fracture: Secondary | ICD-10-CM | POA: Diagnosis not present

## 2019-05-08 DIAGNOSIS — Z1231 Encounter for screening mammogram for malignant neoplasm of breast: Secondary | ICD-10-CM | POA: Diagnosis not present

## 2019-05-08 DIAGNOSIS — Z780 Asymptomatic menopausal state: Secondary | ICD-10-CM | POA: Diagnosis not present

## 2019-05-08 DIAGNOSIS — Z13820 Encounter for screening for osteoporosis: Secondary | ICD-10-CM

## 2019-05-09 ENCOUNTER — Other Ambulatory Visit: Payer: Self-pay | Admitting: Physician Assistant

## 2019-05-09 DIAGNOSIS — R928 Other abnormal and inconclusive findings on diagnostic imaging of breast: Secondary | ICD-10-CM

## 2019-05-14 DIAGNOSIS — M810 Age-related osteoporosis without current pathological fracture: Secondary | ICD-10-CM | POA: Diagnosis not present

## 2019-05-14 DIAGNOSIS — Z8639 Personal history of other endocrine, nutritional and metabolic disease: Secondary | ICD-10-CM | POA: Diagnosis not present

## 2019-05-21 ENCOUNTER — Other Ambulatory Visit: Payer: Self-pay

## 2019-05-21 ENCOUNTER — Other Ambulatory Visit: Payer: Self-pay | Admitting: Physician Assistant

## 2019-05-21 ENCOUNTER — Ambulatory Visit
Admission: RE | Admit: 2019-05-21 | Discharge: 2019-05-21 | Disposition: A | Discharge status: Home / Self Care | Payer: Federal, State, Local not specified - PPO | Source: Ambulatory Visit | Attending: Physician Assistant | Admitting: Physician Assistant

## 2019-05-21 DIAGNOSIS — N6310 Unspecified lump in the right breast, unspecified quadrant: Secondary | ICD-10-CM

## 2019-05-21 DIAGNOSIS — N6312 Unspecified lump in the right breast, upper inner quadrant: Secondary | ICD-10-CM | POA: Diagnosis not present

## 2019-05-21 DIAGNOSIS — R922 Inconclusive mammogram: Secondary | ICD-10-CM | POA: Diagnosis not present

## 2019-05-21 DIAGNOSIS — R928 Other abnormal and inconclusive findings on diagnostic imaging of breast: Secondary | ICD-10-CM

## 2019-06-25 DIAGNOSIS — K080 Exfoliation of teeth due to systemic causes: Secondary | ICD-10-CM | POA: Diagnosis not present

## 2019-09-27 DIAGNOSIS — L820 Inflamed seborrheic keratosis: Secondary | ICD-10-CM | POA: Diagnosis not present

## 2019-09-27 DIAGNOSIS — L821 Other seborrheic keratosis: Secondary | ICD-10-CM | POA: Diagnosis not present

## 2019-09-27 DIAGNOSIS — D1801 Hemangioma of skin and subcutaneous tissue: Secondary | ICD-10-CM | POA: Diagnosis not present

## 2019-09-27 DIAGNOSIS — Z85828 Personal history of other malignant neoplasm of skin: Secondary | ICD-10-CM | POA: Diagnosis not present

## 2019-09-27 DIAGNOSIS — D225 Melanocytic nevi of trunk: Secondary | ICD-10-CM | POA: Diagnosis not present

## 2019-10-14 DIAGNOSIS — Z0000 Encounter for general adult medical examination without abnormal findings: Secondary | ICD-10-CM | POA: Diagnosis not present

## 2019-10-23 DIAGNOSIS — Z8639 Personal history of other endocrine, nutritional and metabolic disease: Secondary | ICD-10-CM | POA: Diagnosis not present

## 2019-10-23 DIAGNOSIS — Z8249 Family history of ischemic heart disease and other diseases of the circulatory system: Secondary | ICD-10-CM | POA: Diagnosis not present

## 2019-10-23 DIAGNOSIS — Z8349 Family history of other endocrine, nutritional and metabolic diseases: Secondary | ICD-10-CM | POA: Diagnosis not present

## 2019-10-23 DIAGNOSIS — Z79899 Other long term (current) drug therapy: Secondary | ICD-10-CM | POA: Diagnosis not present

## 2019-10-23 DIAGNOSIS — E7800 Pure hypercholesterolemia, unspecified: Secondary | ICD-10-CM | POA: Diagnosis not present

## 2019-10-23 DIAGNOSIS — Z833 Family history of diabetes mellitus: Secondary | ICD-10-CM | POA: Diagnosis not present

## 2019-10-23 DIAGNOSIS — Z862 Personal history of diseases of the blood and blood-forming organs and certain disorders involving the immune mechanism: Secondary | ICD-10-CM | POA: Diagnosis not present

## 2019-11-22 ENCOUNTER — Ambulatory Visit
Admission: RE | Admit: 2019-11-22 | Discharge: 2019-11-22 | Disposition: A | Discharge status: Home / Self Care | Payer: Federal, State, Local not specified - PPO | Source: Ambulatory Visit | Attending: Physician Assistant | Admitting: Physician Assistant

## 2019-11-22 ENCOUNTER — Other Ambulatory Visit: Payer: Self-pay | Admitting: Physician Assistant

## 2019-11-22 ENCOUNTER — Other Ambulatory Visit: Payer: Self-pay

## 2019-11-22 DIAGNOSIS — N6011 Diffuse cystic mastopathy of right breast: Secondary | ICD-10-CM | POA: Diagnosis not present

## 2019-11-22 DIAGNOSIS — N6310 Unspecified lump in the right breast, unspecified quadrant: Secondary | ICD-10-CM

## 2020-01-09 DIAGNOSIS — M5032 Other cervical disc degeneration, mid-cervical region, unspecified level: Secondary | ICD-10-CM | POA: Diagnosis not present

## 2020-01-09 DIAGNOSIS — M9902 Segmental and somatic dysfunction of thoracic region: Secondary | ICD-10-CM | POA: Diagnosis not present

## 2020-01-09 DIAGNOSIS — M9901 Segmental and somatic dysfunction of cervical region: Secondary | ICD-10-CM | POA: Diagnosis not present

## 2020-01-09 DIAGNOSIS — M542 Cervicalgia: Secondary | ICD-10-CM | POA: Diagnosis not present

## 2020-01-13 DIAGNOSIS — M5032 Other cervical disc degeneration, mid-cervical region, unspecified level: Secondary | ICD-10-CM | POA: Diagnosis not present

## 2020-01-13 DIAGNOSIS — M9901 Segmental and somatic dysfunction of cervical region: Secondary | ICD-10-CM | POA: Diagnosis not present

## 2020-01-13 DIAGNOSIS — M9902 Segmental and somatic dysfunction of thoracic region: Secondary | ICD-10-CM | POA: Diagnosis not present

## 2020-01-13 DIAGNOSIS — M542 Cervicalgia: Secondary | ICD-10-CM | POA: Diagnosis not present

## 2020-01-15 DIAGNOSIS — M9902 Segmental and somatic dysfunction of thoracic region: Secondary | ICD-10-CM | POA: Diagnosis not present

## 2020-01-15 DIAGNOSIS — M5032 Other cervical disc degeneration, mid-cervical region, unspecified level: Secondary | ICD-10-CM | POA: Diagnosis not present

## 2020-01-15 DIAGNOSIS — M542 Cervicalgia: Secondary | ICD-10-CM | POA: Diagnosis not present

## 2020-01-15 DIAGNOSIS — M9901 Segmental and somatic dysfunction of cervical region: Secondary | ICD-10-CM | POA: Diagnosis not present

## 2020-01-17 DIAGNOSIS — M9901 Segmental and somatic dysfunction of cervical region: Secondary | ICD-10-CM | POA: Diagnosis not present

## 2020-01-17 DIAGNOSIS — M542 Cervicalgia: Secondary | ICD-10-CM | POA: Diagnosis not present

## 2020-01-17 DIAGNOSIS — M5032 Other cervical disc degeneration, mid-cervical region, unspecified level: Secondary | ICD-10-CM | POA: Diagnosis not present

## 2020-01-17 DIAGNOSIS — M9902 Segmental and somatic dysfunction of thoracic region: Secondary | ICD-10-CM | POA: Diagnosis not present

## 2020-01-27 DIAGNOSIS — M5032 Other cervical disc degeneration, mid-cervical region, unspecified level: Secondary | ICD-10-CM | POA: Diagnosis not present

## 2020-01-27 DIAGNOSIS — M9901 Segmental and somatic dysfunction of cervical region: Secondary | ICD-10-CM | POA: Diagnosis not present

## 2020-01-27 DIAGNOSIS — M9902 Segmental and somatic dysfunction of thoracic region: Secondary | ICD-10-CM | POA: Diagnosis not present

## 2020-01-27 DIAGNOSIS — M542 Cervicalgia: Secondary | ICD-10-CM | POA: Diagnosis not present

## 2020-01-29 DIAGNOSIS — M5032 Other cervical disc degeneration, mid-cervical region, unspecified level: Secondary | ICD-10-CM | POA: Diagnosis not present

## 2020-01-29 DIAGNOSIS — M9902 Segmental and somatic dysfunction of thoracic region: Secondary | ICD-10-CM | POA: Diagnosis not present

## 2020-01-29 DIAGNOSIS — M9901 Segmental and somatic dysfunction of cervical region: Secondary | ICD-10-CM | POA: Diagnosis not present

## 2020-01-29 DIAGNOSIS — M542 Cervicalgia: Secondary | ICD-10-CM | POA: Diagnosis not present

## 2020-01-31 DIAGNOSIS — M5032 Other cervical disc degeneration, mid-cervical region, unspecified level: Secondary | ICD-10-CM | POA: Diagnosis not present

## 2020-01-31 DIAGNOSIS — M542 Cervicalgia: Secondary | ICD-10-CM | POA: Diagnosis not present

## 2020-01-31 DIAGNOSIS — M9902 Segmental and somatic dysfunction of thoracic region: Secondary | ICD-10-CM | POA: Diagnosis not present

## 2020-01-31 DIAGNOSIS — M9901 Segmental and somatic dysfunction of cervical region: Secondary | ICD-10-CM | POA: Diagnosis not present

## 2020-02-03 DIAGNOSIS — M5032 Other cervical disc degeneration, mid-cervical region, unspecified level: Secondary | ICD-10-CM | POA: Diagnosis not present

## 2020-02-03 DIAGNOSIS — M9901 Segmental and somatic dysfunction of cervical region: Secondary | ICD-10-CM | POA: Diagnosis not present

## 2020-02-03 DIAGNOSIS — M9902 Segmental and somatic dysfunction of thoracic region: Secondary | ICD-10-CM | POA: Diagnosis not present

## 2020-02-03 DIAGNOSIS — M542 Cervicalgia: Secondary | ICD-10-CM | POA: Diagnosis not present

## 2020-02-05 DIAGNOSIS — M9901 Segmental and somatic dysfunction of cervical region: Secondary | ICD-10-CM | POA: Diagnosis not present

## 2020-02-05 DIAGNOSIS — M5032 Other cervical disc degeneration, mid-cervical region, unspecified level: Secondary | ICD-10-CM | POA: Diagnosis not present

## 2020-02-05 DIAGNOSIS — M542 Cervicalgia: Secondary | ICD-10-CM | POA: Diagnosis not present

## 2020-02-05 DIAGNOSIS — M9902 Segmental and somatic dysfunction of thoracic region: Secondary | ICD-10-CM | POA: Diagnosis not present

## 2020-02-07 DIAGNOSIS — M9901 Segmental and somatic dysfunction of cervical region: Secondary | ICD-10-CM | POA: Diagnosis not present

## 2020-02-07 DIAGNOSIS — M542 Cervicalgia: Secondary | ICD-10-CM | POA: Diagnosis not present

## 2020-02-07 DIAGNOSIS — M5032 Other cervical disc degeneration, mid-cervical region, unspecified level: Secondary | ICD-10-CM | POA: Diagnosis not present

## 2020-02-07 DIAGNOSIS — M9902 Segmental and somatic dysfunction of thoracic region: Secondary | ICD-10-CM | POA: Diagnosis not present

## 2020-02-10 DIAGNOSIS — M542 Cervicalgia: Secondary | ICD-10-CM | POA: Diagnosis not present

## 2020-02-10 DIAGNOSIS — M5032 Other cervical disc degeneration, mid-cervical region, unspecified level: Secondary | ICD-10-CM | POA: Diagnosis not present

## 2020-02-10 DIAGNOSIS — M9901 Segmental and somatic dysfunction of cervical region: Secondary | ICD-10-CM | POA: Diagnosis not present

## 2020-02-10 DIAGNOSIS — M9902 Segmental and somatic dysfunction of thoracic region: Secondary | ICD-10-CM | POA: Diagnosis not present

## 2020-02-12 DIAGNOSIS — M5032 Other cervical disc degeneration, mid-cervical region, unspecified level: Secondary | ICD-10-CM | POA: Diagnosis not present

## 2020-02-12 DIAGNOSIS — M9901 Segmental and somatic dysfunction of cervical region: Secondary | ICD-10-CM | POA: Diagnosis not present

## 2020-02-12 DIAGNOSIS — M542 Cervicalgia: Secondary | ICD-10-CM | POA: Diagnosis not present

## 2020-02-12 DIAGNOSIS — M9902 Segmental and somatic dysfunction of thoracic region: Secondary | ICD-10-CM | POA: Diagnosis not present

## 2020-02-14 DIAGNOSIS — M542 Cervicalgia: Secondary | ICD-10-CM | POA: Diagnosis not present

## 2020-02-14 DIAGNOSIS — M5032 Other cervical disc degeneration, mid-cervical region, unspecified level: Secondary | ICD-10-CM | POA: Diagnosis not present

## 2020-02-14 DIAGNOSIS — M9902 Segmental and somatic dysfunction of thoracic region: Secondary | ICD-10-CM | POA: Diagnosis not present

## 2020-02-14 DIAGNOSIS — M9901 Segmental and somatic dysfunction of cervical region: Secondary | ICD-10-CM | POA: Diagnosis not present

## 2020-02-17 DIAGNOSIS — M9901 Segmental and somatic dysfunction of cervical region: Secondary | ICD-10-CM | POA: Diagnosis not present

## 2020-02-17 DIAGNOSIS — M9902 Segmental and somatic dysfunction of thoracic region: Secondary | ICD-10-CM | POA: Diagnosis not present

## 2020-02-17 DIAGNOSIS — M5032 Other cervical disc degeneration, mid-cervical region, unspecified level: Secondary | ICD-10-CM | POA: Diagnosis not present

## 2020-02-17 DIAGNOSIS — M542 Cervicalgia: Secondary | ICD-10-CM | POA: Diagnosis not present

## 2020-02-20 DIAGNOSIS — M542 Cervicalgia: Secondary | ICD-10-CM | POA: Diagnosis not present

## 2020-02-20 DIAGNOSIS — M5032 Other cervical disc degeneration, mid-cervical region, unspecified level: Secondary | ICD-10-CM | POA: Diagnosis not present

## 2020-02-20 DIAGNOSIS — M9901 Segmental and somatic dysfunction of cervical region: Secondary | ICD-10-CM | POA: Diagnosis not present

## 2020-02-20 DIAGNOSIS — M9902 Segmental and somatic dysfunction of thoracic region: Secondary | ICD-10-CM | POA: Diagnosis not present

## 2020-02-24 DIAGNOSIS — M542 Cervicalgia: Secondary | ICD-10-CM | POA: Diagnosis not present

## 2020-02-24 DIAGNOSIS — M9901 Segmental and somatic dysfunction of cervical region: Secondary | ICD-10-CM | POA: Diagnosis not present

## 2020-02-24 DIAGNOSIS — M5032 Other cervical disc degeneration, mid-cervical region, unspecified level: Secondary | ICD-10-CM | POA: Diagnosis not present

## 2020-02-24 DIAGNOSIS — M9902 Segmental and somatic dysfunction of thoracic region: Secondary | ICD-10-CM | POA: Diagnosis not present

## 2020-02-27 DIAGNOSIS — M5032 Other cervical disc degeneration, mid-cervical region, unspecified level: Secondary | ICD-10-CM | POA: Diagnosis not present

## 2020-02-27 DIAGNOSIS — M542 Cervicalgia: Secondary | ICD-10-CM | POA: Diagnosis not present

## 2020-02-27 DIAGNOSIS — M9901 Segmental and somatic dysfunction of cervical region: Secondary | ICD-10-CM | POA: Diagnosis not present

## 2020-02-27 DIAGNOSIS — M9902 Segmental and somatic dysfunction of thoracic region: Secondary | ICD-10-CM | POA: Diagnosis not present

## 2020-03-02 DIAGNOSIS — M9901 Segmental and somatic dysfunction of cervical region: Secondary | ICD-10-CM | POA: Diagnosis not present

## 2020-03-02 DIAGNOSIS — M542 Cervicalgia: Secondary | ICD-10-CM | POA: Diagnosis not present

## 2020-03-02 DIAGNOSIS — M5032 Other cervical disc degeneration, mid-cervical region, unspecified level: Secondary | ICD-10-CM | POA: Diagnosis not present

## 2020-03-02 DIAGNOSIS — M9902 Segmental and somatic dysfunction of thoracic region: Secondary | ICD-10-CM | POA: Diagnosis not present

## 2020-03-05 DIAGNOSIS — M5032 Other cervical disc degeneration, mid-cervical region, unspecified level: Secondary | ICD-10-CM | POA: Diagnosis not present

## 2020-03-05 DIAGNOSIS — M9901 Segmental and somatic dysfunction of cervical region: Secondary | ICD-10-CM | POA: Diagnosis not present

## 2020-03-05 DIAGNOSIS — M9902 Segmental and somatic dysfunction of thoracic region: Secondary | ICD-10-CM | POA: Diagnosis not present

## 2020-03-05 DIAGNOSIS — M542 Cervicalgia: Secondary | ICD-10-CM | POA: Diagnosis not present

## 2020-03-09 DIAGNOSIS — M542 Cervicalgia: Secondary | ICD-10-CM | POA: Diagnosis not present

## 2020-03-09 DIAGNOSIS — M9902 Segmental and somatic dysfunction of thoracic region: Secondary | ICD-10-CM | POA: Diagnosis not present

## 2020-03-09 DIAGNOSIS — M9901 Segmental and somatic dysfunction of cervical region: Secondary | ICD-10-CM | POA: Diagnosis not present

## 2020-03-09 DIAGNOSIS — M5032 Other cervical disc degeneration, mid-cervical region, unspecified level: Secondary | ICD-10-CM | POA: Diagnosis not present

## 2020-03-12 DIAGNOSIS — M542 Cervicalgia: Secondary | ICD-10-CM | POA: Diagnosis not present

## 2020-03-12 DIAGNOSIS — M9902 Segmental and somatic dysfunction of thoracic region: Secondary | ICD-10-CM | POA: Diagnosis not present

## 2020-03-12 DIAGNOSIS — M9901 Segmental and somatic dysfunction of cervical region: Secondary | ICD-10-CM | POA: Diagnosis not present

## 2020-03-12 DIAGNOSIS — M5032 Other cervical disc degeneration, mid-cervical region, unspecified level: Secondary | ICD-10-CM | POA: Diagnosis not present

## 2020-03-16 DIAGNOSIS — M542 Cervicalgia: Secondary | ICD-10-CM | POA: Diagnosis not present

## 2020-03-16 DIAGNOSIS — M9901 Segmental and somatic dysfunction of cervical region: Secondary | ICD-10-CM | POA: Diagnosis not present

## 2020-03-16 DIAGNOSIS — M5032 Other cervical disc degeneration, mid-cervical region, unspecified level: Secondary | ICD-10-CM | POA: Diagnosis not present

## 2020-03-16 DIAGNOSIS — M9902 Segmental and somatic dysfunction of thoracic region: Secondary | ICD-10-CM | POA: Diagnosis not present

## 2020-03-27 DIAGNOSIS — D1801 Hemangioma of skin and subcutaneous tissue: Secondary | ICD-10-CM | POA: Diagnosis not present

## 2020-03-27 DIAGNOSIS — L821 Other seborrheic keratosis: Secondary | ICD-10-CM | POA: Diagnosis not present

## 2020-03-27 DIAGNOSIS — L905 Scar conditions and fibrosis of skin: Secondary | ICD-10-CM | POA: Diagnosis not present

## 2020-03-27 DIAGNOSIS — Z85828 Personal history of other malignant neoplasm of skin: Secondary | ICD-10-CM | POA: Diagnosis not present

## 2020-03-27 DIAGNOSIS — D485 Neoplasm of uncertain behavior of skin: Secondary | ICD-10-CM | POA: Diagnosis not present

## 2020-03-27 DIAGNOSIS — D0471 Carcinoma in situ of skin of right lower limb, including hip: Secondary | ICD-10-CM | POA: Diagnosis not present

## 2020-04-06 DIAGNOSIS — D0471 Carcinoma in situ of skin of right lower limb, including hip: Secondary | ICD-10-CM | POA: Diagnosis not present

## 2020-05-04 DIAGNOSIS — Z01419 Encounter for gynecological examination (general) (routine) without abnormal findings: Secondary | ICD-10-CM | POA: Diagnosis not present

## 2020-05-04 DIAGNOSIS — Z1389 Encounter for screening for other disorder: Secondary | ICD-10-CM | POA: Diagnosis not present

## 2020-05-04 DIAGNOSIS — Z130 Encounter for screening for diseases of the blood and blood-forming organs and certain disorders involving the immune mechanism: Secondary | ICD-10-CM | POA: Diagnosis not present

## 2020-05-04 DIAGNOSIS — Z6825 Body mass index (BMI) 25.0-25.9, adult: Secondary | ICD-10-CM | POA: Diagnosis not present

## 2020-05-25 ENCOUNTER — Ambulatory Visit
Admission: RE | Admit: 2020-05-25 | Discharge: 2020-05-25 | Disposition: A | Discharge status: Home / Self Care | Payer: Federal, State, Local not specified - PPO | Source: Ambulatory Visit | Attending: Physician Assistant | Admitting: Physician Assistant

## 2020-05-25 ENCOUNTER — Other Ambulatory Visit: Payer: Self-pay

## 2020-05-25 DIAGNOSIS — N6310 Unspecified lump in the right breast, unspecified quadrant: Secondary | ICD-10-CM

## 2020-05-25 DIAGNOSIS — R922 Inconclusive mammogram: Secondary | ICD-10-CM | POA: Diagnosis not present

## 2020-05-25 DIAGNOSIS — N6312 Unspecified lump in the right breast, upper inner quadrant: Secondary | ICD-10-CM | POA: Diagnosis not present

## 2020-09-17 IMAGING — MG DIGITAL SCREENING BILATERAL MAMMOGRAM WITH TOMO AND CAD
6 of 10 series · 6 of 30 positions shown · non-contrast
Comparison: Previous exam(s).

CLINICAL DATA: Screening.

EXAM:
DIGITAL SCREENING BILATERAL MAMMOGRAM WITH TOMO AND CAD

[R MLO synth-2D]
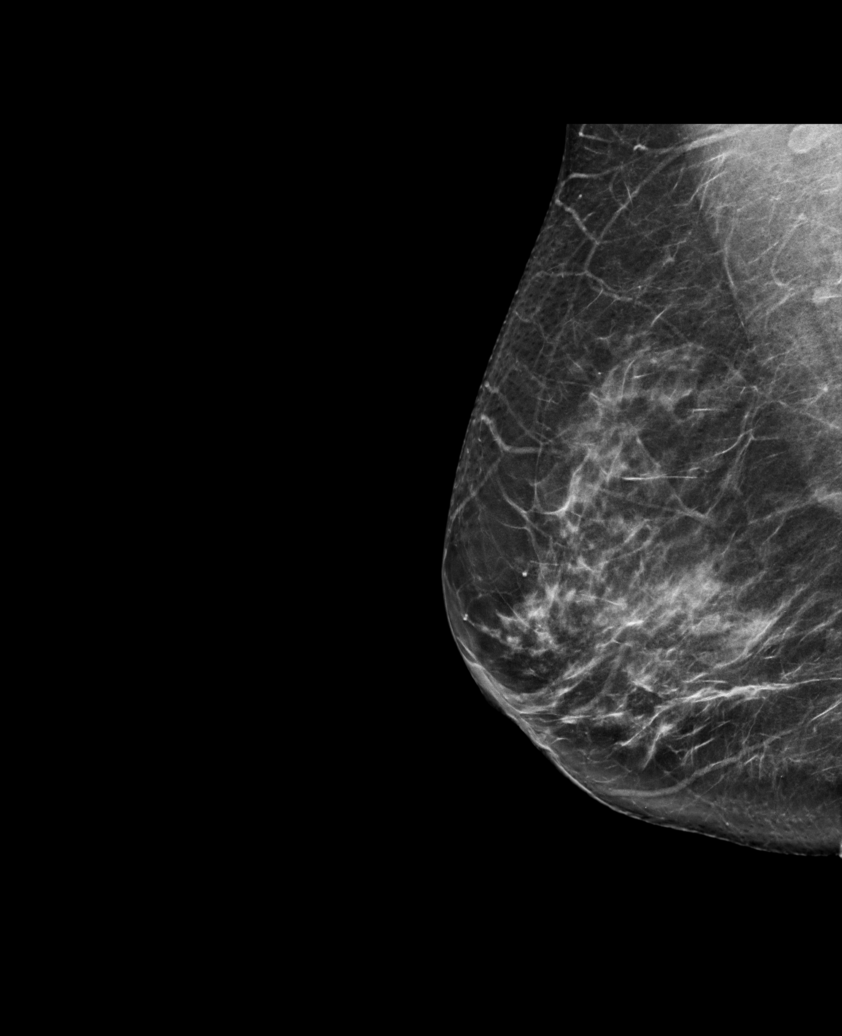

[L CC synth-2D]
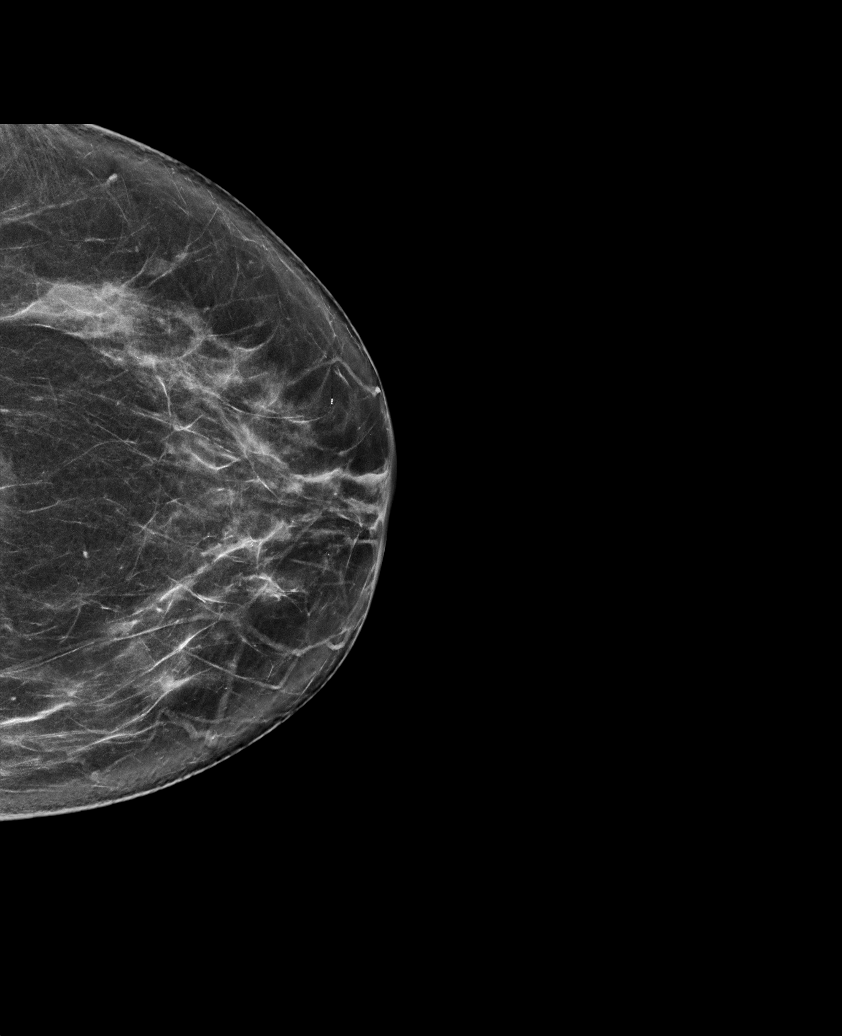

[R CC synth-2D (1 of 2)]
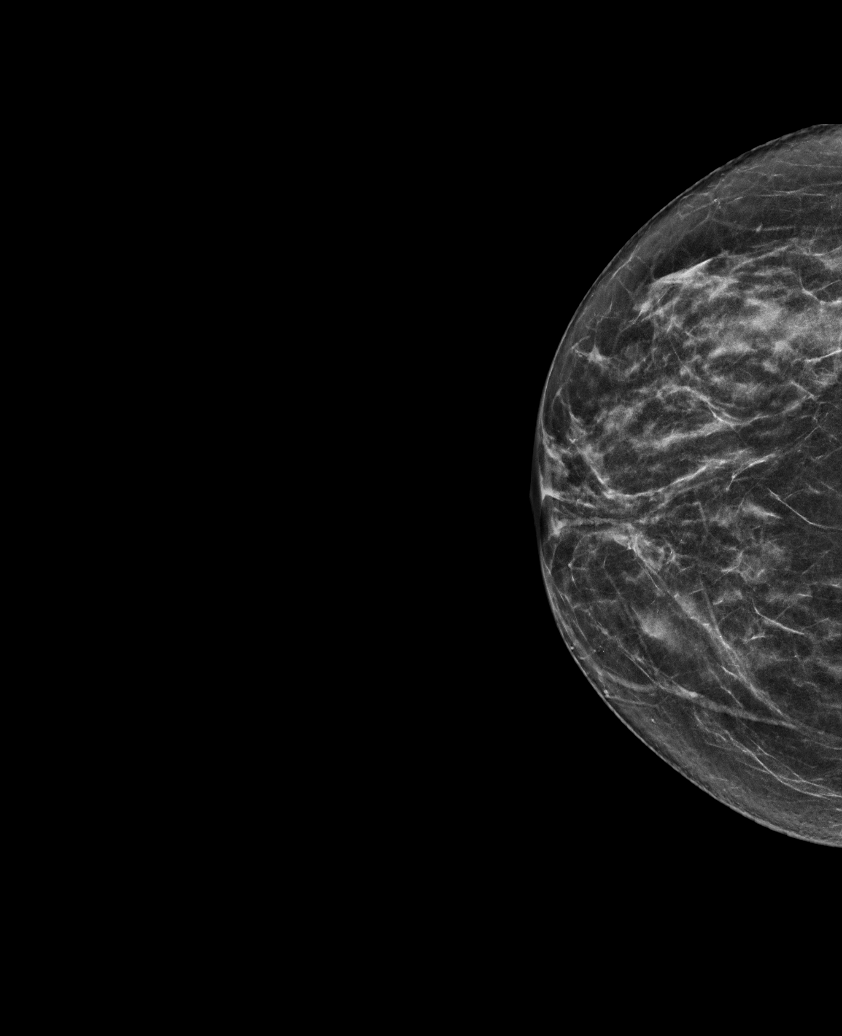

[R CC synth-2D (2 of 2)]
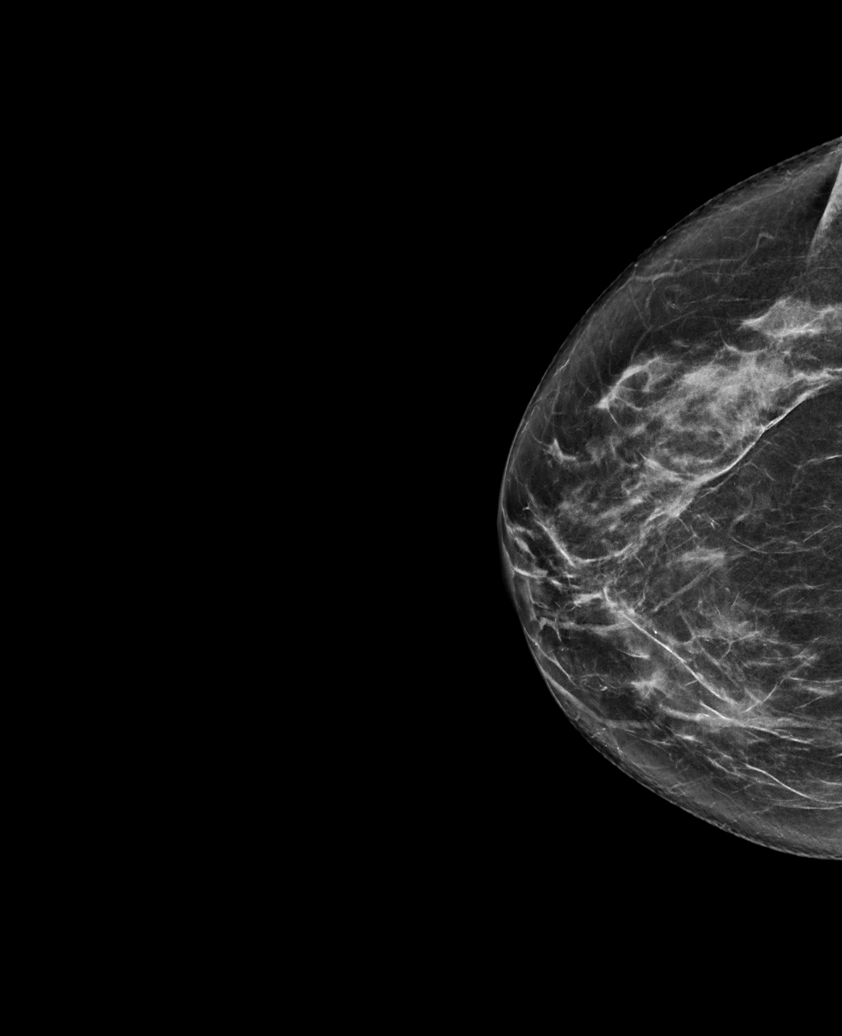

[L MLO synth-2D]
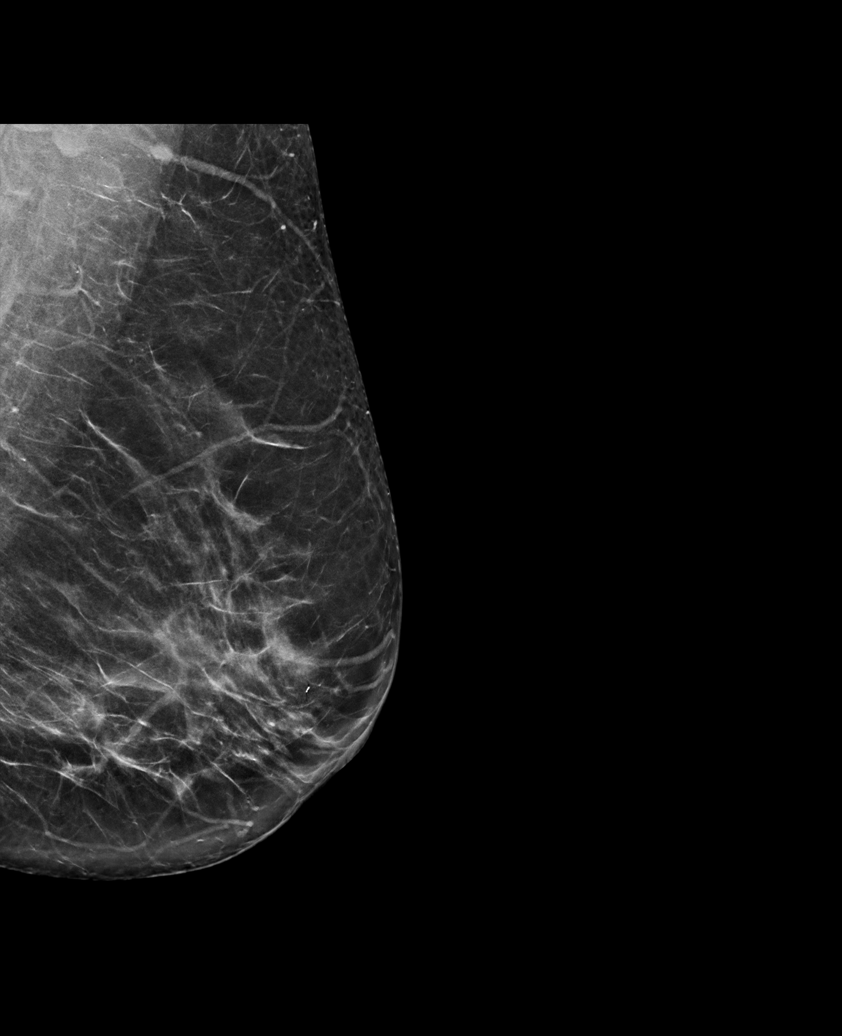

[L CC tomo · tomo slice 37/72.0]
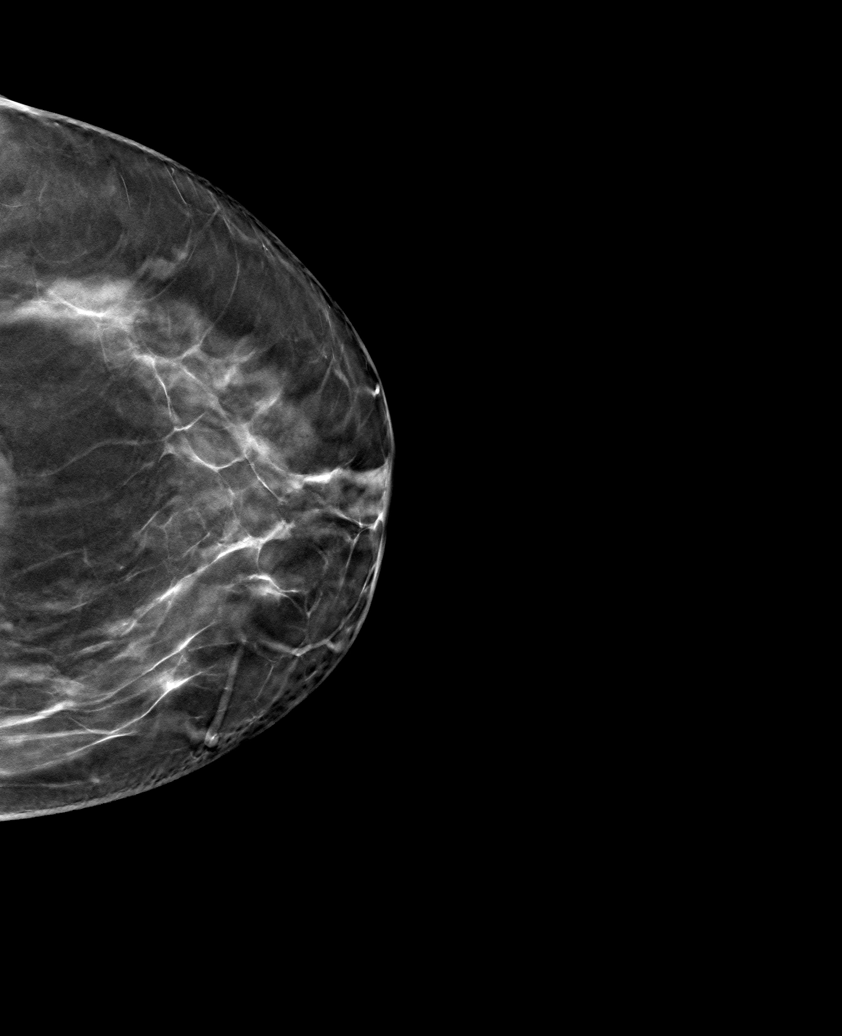

[6 of 30 positions shown; findings below may reference images not displayed]

ACR Breast Density Category c: The breast tissue is heterogeneously
dense, which may obscure small masses.
FINDINGS: In the right breast, a possible mass warrants further evaluation. In
the left breast, no findings suspicious for malignancy. Images were
processed with CAD.
IMPRESSION: Further evaluation is suggested for possible mass in the right
breast.

RECOMMENDATION:
Diagnostic mammogram and possibly ultrasound of the right breast.
(Code:[id number])

The patient will be contacted regarding the findings, and additional
imaging will be scheduled.

BI-RADS CATEGORY  0: Incomplete. Need additional imaging evaluation
and/or prior mammograms for comparison.

## 2020-09-28 DIAGNOSIS — C44719 Basal cell carcinoma of skin of left lower limb, including hip: Secondary | ICD-10-CM | POA: Diagnosis not present

## 2020-09-28 DIAGNOSIS — D485 Neoplasm of uncertain behavior of skin: Secondary | ICD-10-CM | POA: Diagnosis not present

## 2020-09-28 DIAGNOSIS — C44612 Basal cell carcinoma of skin of right upper limb, including shoulder: Secondary | ICD-10-CM | POA: Diagnosis not present

## 2020-09-28 DIAGNOSIS — Z85828 Personal history of other malignant neoplasm of skin: Secondary | ICD-10-CM | POA: Diagnosis not present

## 2020-09-28 DIAGNOSIS — L905 Scar conditions and fibrosis of skin: Secondary | ICD-10-CM | POA: Diagnosis not present

## 2020-09-28 DIAGNOSIS — L814 Other melanin hyperpigmentation: Secondary | ICD-10-CM | POA: Diagnosis not present

## 2020-10-28 DIAGNOSIS — C44612 Basal cell carcinoma of skin of right upper limb, including shoulder: Secondary | ICD-10-CM | POA: Diagnosis not present

## 2020-10-28 DIAGNOSIS — L570 Actinic keratosis: Secondary | ICD-10-CM | POA: Diagnosis not present

## 2020-10-28 DIAGNOSIS — L439 Lichen planus, unspecified: Secondary | ICD-10-CM | POA: Diagnosis not present

## 2020-10-28 DIAGNOSIS — D485 Neoplasm of uncertain behavior of skin: Secondary | ICD-10-CM | POA: Diagnosis not present

## 2020-10-28 DIAGNOSIS — C44719 Basal cell carcinoma of skin of left lower limb, including hip: Secondary | ICD-10-CM | POA: Diagnosis not present

## 2020-11-04 DIAGNOSIS — Z0000 Encounter for general adult medical examination without abnormal findings: Secondary | ICD-10-CM | POA: Diagnosis not present

## 2020-11-04 DIAGNOSIS — G4700 Insomnia, unspecified: Secondary | ICD-10-CM | POA: Diagnosis not present

## 2020-11-04 DIAGNOSIS — M810 Age-related osteoporosis without current pathological fracture: Secondary | ICD-10-CM | POA: Diagnosis not present

## 2020-11-04 DIAGNOSIS — E7800 Pure hypercholesterolemia, unspecified: Secondary | ICD-10-CM | POA: Diagnosis not present

## 2020-11-06 DIAGNOSIS — L570 Actinic keratosis: Secondary | ICD-10-CM | POA: Diagnosis not present

## 2020-12-24 DIAGNOSIS — Z1211 Encounter for screening for malignant neoplasm of colon: Secondary | ICD-10-CM | POA: Diagnosis not present

## 2021-02-03 DIAGNOSIS — E7800 Pure hypercholesterolemia, unspecified: Secondary | ICD-10-CM | POA: Diagnosis not present

## 2021-03-29 DIAGNOSIS — Z85828 Personal history of other malignant neoplasm of skin: Secondary | ICD-10-CM | POA: Diagnosis not present

## 2021-03-29 DIAGNOSIS — L905 Scar conditions and fibrosis of skin: Secondary | ICD-10-CM | POA: Diagnosis not present

## 2021-04-20 ENCOUNTER — Other Ambulatory Visit: Payer: Self-pay | Admitting: Physician Assistant

## 2021-04-20 DIAGNOSIS — Z09 Encounter for follow-up examination after completed treatment for conditions other than malignant neoplasm: Secondary | ICD-10-CM

## 2021-04-28 ENCOUNTER — Other Ambulatory Visit: Payer: Self-pay | Admitting: Physician Assistant

## 2021-04-28 DIAGNOSIS — N6310 Unspecified lump in the right breast, unspecified quadrant: Secondary | ICD-10-CM

## 2021-04-28 DIAGNOSIS — N63 Unspecified lump in unspecified breast: Secondary | ICD-10-CM

## 2021-05-01 ENCOUNTER — Other Ambulatory Visit: Payer: Self-pay

## 2021-05-01 ENCOUNTER — Emergency Department (HOSPITAL_BASED_OUTPATIENT_CLINIC_OR_DEPARTMENT_OTHER): Payer: Federal, State, Local not specified - PPO

## 2021-05-01 ENCOUNTER — Encounter (HOSPITAL_BASED_OUTPATIENT_CLINIC_OR_DEPARTMENT_OTHER): Payer: Self-pay | Admitting: *Deleted

## 2021-05-01 ENCOUNTER — Emergency Department (HOSPITAL_BASED_OUTPATIENT_CLINIC_OR_DEPARTMENT_OTHER)
Admission: EM | Admit: 2021-05-01 | Discharge: 2021-05-01 | Disposition: A | Discharge status: Home / Self Care | Payer: Federal, State, Local not specified - PPO | Attending: Emergency Medicine | Admitting: Emergency Medicine

## 2021-05-01 DIAGNOSIS — W010XXA Fall on same level from slipping, tripping and stumbling without subsequent striking against object, initial encounter: Secondary | ICD-10-CM | POA: Insufficient documentation

## 2021-05-01 DIAGNOSIS — S62115A Nondisplaced fracture of triquetrum [cuneiform] bone, left wrist, initial encounter for closed fracture: Secondary | ICD-10-CM

## 2021-05-01 DIAGNOSIS — Y93H2 Activity, gardening and landscaping: Secondary | ICD-10-CM | POA: Diagnosis not present

## 2021-05-01 DIAGNOSIS — M25532 Pain in left wrist: Secondary | ICD-10-CM | POA: Diagnosis not present

## 2021-05-01 DIAGNOSIS — S6992XA Unspecified injury of left wrist, hand and finger(s), initial encounter: Secondary | ICD-10-CM | POA: Diagnosis not present

## 2021-05-01 NOTE — ED Triage Notes (Signed)
Pt slipped and fell on Monday, Injury to Left wrist now with bruising and soreness.

## 2021-05-01 NOTE — ED Provider Notes (Signed)
[facility] EMERGENCY DEPT Provider Note   CSN: [id number] Arrival date & time: 05/01/21  0756     History Chief Complaint  Patient presents with   Wrist Pain    Whitney Cruz is a 60 y.o. female.  HPI     60 year old female presents with concern for left wrist pain after a fall.  Reports she was gardening and she slipped and fell she believes onto her outstretched hand on Monday.  Has pain to the left wrist on the ulnar side and contusion to the dorsum of her hand on that side.  Reports that the pain is approximately a 5 out of 10.  It radiates down towards her fourth and fifth finger.  Feels the pain is more severe than she would expect at this point after the fall, so wanted to get it checked out.  Denies any other injuries from the fall, no head trauma, headaches or loss of consciousness.  Denies any other acute illness or other concerns.  No numbness.  She is able to move her fingers hand and wrist.  Past Medical History:  Diagnosis Date   Adhesive capsulitis of right shoulder    Arthritis    hands   Depression    hormone related    There are no problems to display for this patient.   Past Surgical History:  Procedure Laterality Date   ABDOMINAL HYSTERECTOMY     SHOULDER ARTHROSCOPY Left    SHOULDER ARTHROSCOPY Right 05/04/2018   Procedure: ARTHROSCOPY SHOULDER;  Surgeon: Marcene Corning, MD;  Location: [facility];  Service: Orthopedics;  Laterality: Right;   TONSILLECTOMY     VOCAL CORD INJECTION       OB History   No obstetric history on file.     Family History  Problem Relation Age of Onset   Breast cancer Mother 58   Breast cancer Maternal Aunt    Breast cancer Maternal Grandmother     Social History   Tobacco Use   Smoking status: Never   Smokeless tobacco: Never  Vaping Use   Vaping Use: Never used  Substance Use Topics   Alcohol use: Yes    Comment: social   Drug use: Never    Home Medications Prior to  Admission medications   Medication Sig Start Date End Date Taking? Authorizing Provider  calcium-vitamin D (OSCAL WITH D) 500-200 MG-UNIT tablet Take 1 tablet by mouth.   Yes [provider]  cholecalciferol (VITAMIN D) 1000 units tablet Take 1,000 Units by mouth daily.   Yes [provider]  magnesium oxide (MAG-OX) 400 MG tablet Take 400 mg by mouth daily.   Yes [provider]  Multiple Vitamin (MULTIVITAMIN WITH MINERALS) TABS tablet Take 1 tablet by mouth daily.   Yes [provider]  5-Hydroxytryptophan (5-HTP) 100 MG CAPS Take by mouth.    [provider]  HYDROcodone-acetaminophen (NORCO) 5-325 MG tablet Take 1-2 tablets by mouth every 6 (six) hours as needed for moderate pain. 05/04/18   Elodia Florence, PA-C    Allergies    Patient has no known allergies.  Review of Systems   Review of Systems  Constitutional:  Negative for fever.  HENT:  Negative for sore throat.   Respiratory:  Negative for cough and shortness of breath.   Cardiovascular:  Negative for chest pain.  Gastrointestinal:  Negative for abdominal pain.  Genitourinary:  Negative for difficulty urinating.  Musculoskeletal:  Positive for arthralgias. Negative for neck pain.  Skin:  Positive for color change. Negative for rash.  Neurological:  Negative for syncope and headaches.   Physical Exam Updated Vital Signs BP (!) 157/92 (BP Location: Right Arm)   Pulse 84   Temp 98.8 F (37.1 C) (Oral)   Resp 16   Ht 5\' 3"  (1.6 m)   Wt 63.5 kg   SpO2 100%   BMI 24.80 kg/m   Physical Exam Vitals and nursing note reviewed.  Constitutional:      General: She is not in acute distress.    Appearance: Normal appearance. She is not ill-appearing, toxic-appearing or diaphoretic.  HENT:     Head: Normocephalic.  Eyes:     Conjunctiva/sclera: Conjunctivae normal.  Cardiovascular:     Rate and Rhythm: Normal rate and regular rhythm.     Pulses: Normal pulses.  Pulmonary:      Effort: Pulmonary effort is normal. No respiratory distress.  Musculoskeletal:        General: Swelling and tenderness (contusion, swelling dorsum ulnar side of hand, no snuff box tenderness) present. No deformity or signs of injury.     Cervical back: No rigidity.     Comments: Normal finger abduction, opponens, wrist extension and flexion, flexion/extension fingers, normal pulses, normal sensation  Skin:    General: Skin is warm and dry.     Coloration: Skin is not jaundiced or pale.  Neurological:     General: No focal deficit present.     Mental Status: She is alert and oriented to person, place, and time.    ED Results / Procedures / Treatments   Labs (all labs ordered are listed, but only abnormal results are displayed) Labs Reviewed - No data to display  EKG None  Radiology DG Wrist Complete Left  Result Date: 05/01/2021 CLINICAL DATA:  Fall, LEFT wrist pain after fall 6 days ago. EXAM: LEFT WRIST - COMPLETE 3+ VIEW COMPARISON:  None. FINDINGS: Slightly displaced fracture deformity of the triquetrum, of uncertain age. Remainder of the carpal bones appear intact and normally aligned. Distal radius and ulna appear intact and normally aligned. IMPRESSION: Slightly displaced fracture deformity at the dorsal cortex of the triquetrum, of uncertain age, presumably acute given the recent trauma. Electronically Signed   By: 07/02/2021 M.D.   On: 05/01/2021 08:50    Procedures Procedures   Medications Ordered in ED Medications - No data to display  ED Course  I have reviewed the triage vital signs and the nursing notes.  Pertinent labs & imaging results that were available during my care of the patient were reviewed by me and considered in my medical decision making (see chart for details).    MDM Rules/Calculators/A&P                           61 year old female presents with concern for left wrist pain after a fall.  NV intact, normal ROM fingers. Wrist XR shows fracture of  the triquetrum.    Placed in short arm splint. Discussed with Dr. 46 and recommend follow up with him as an outpatient. Pain controlled with OTC NSAIDs. Patient discharged in stable condition with understanding of reasons to return.    Final Clinical Impression(s) / ED Diagnoses Final diagnoses:  Closed traumatic nondisplaced fracture of triquetrum of left wrist, initial encounter    Rx / DC Orders ED Discharge Orders     None        Janee Morn, MD 05/01/21 [phone]

## 2021-05-04 DIAGNOSIS — S62115A Nondisplaced fracture of triquetrum [cuneiform] bone, left wrist, initial encounter for closed fracture: Secondary | ICD-10-CM | POA: Diagnosis not present

## 2021-06-03 DIAGNOSIS — S62115A Nondisplaced fracture of triquetrum [cuneiform] bone, left wrist, initial encounter for closed fracture: Secondary | ICD-10-CM | POA: Diagnosis not present

## 2021-06-21 ENCOUNTER — Ambulatory Visit
Admission: RE | Admit: 2021-06-21 | Discharge: 2021-06-21 | Disposition: A | Discharge status: Home / Self Care | Payer: Federal, State, Local not specified - PPO | Source: Ambulatory Visit | Attending: Physician Assistant | Admitting: Physician Assistant

## 2021-06-21 ENCOUNTER — Other Ambulatory Visit: Payer: Self-pay

## 2021-06-21 DIAGNOSIS — N6310 Unspecified lump in the right breast, unspecified quadrant: Secondary | ICD-10-CM

## 2021-06-21 DIAGNOSIS — R922 Inconclusive mammogram: Secondary | ICD-10-CM | POA: Diagnosis not present

## 2021-06-21 DIAGNOSIS — N63 Unspecified lump in unspecified breast: Secondary | ICD-10-CM

## 2021-07-06 DIAGNOSIS — S62115D Nondisplaced fracture of triquetrum [cuneiform] bone, left wrist, subsequent encounter for fracture with routine healing: Secondary | ICD-10-CM | POA: Diagnosis not present

## 2021-07-20 DIAGNOSIS — G4700 Insomnia, unspecified: Secondary | ICD-10-CM | POA: Diagnosis not present

## 2021-07-20 DIAGNOSIS — Z01419 Encounter for gynecological examination (general) (routine) without abnormal findings: Secondary | ICD-10-CM | POA: Diagnosis not present

## 2021-07-20 DIAGNOSIS — Z6826 Body mass index (BMI) 26.0-26.9, adult: Secondary | ICD-10-CM | POA: Diagnosis not present

## 2021-07-20 DIAGNOSIS — Z1389 Encounter for screening for other disorder: Secondary | ICD-10-CM | POA: Diagnosis not present

## 2021-08-17 DIAGNOSIS — S62115D Nondisplaced fracture of triquetrum [cuneiform] bone, left wrist, subsequent encounter for fracture with routine healing: Secondary | ICD-10-CM | POA: Diagnosis not present

## 2021-09-03 DIAGNOSIS — M25552 Pain in left hip: Secondary | ICD-10-CM | POA: Diagnosis not present

## 2021-09-03 DIAGNOSIS — M25561 Pain in right knee: Secondary | ICD-10-CM | POA: Diagnosis not present

## 2021-09-03 DIAGNOSIS — M25562 Pain in left knee: Secondary | ICD-10-CM | POA: Diagnosis not present

## 2021-09-03 DIAGNOSIS — M25551 Pain in right hip: Secondary | ICD-10-CM | POA: Diagnosis not present

## 2021-09-07 DIAGNOSIS — E538 Deficiency of other specified B group vitamins: Secondary | ICD-10-CM | POA: Diagnosis not present

## 2021-09-07 DIAGNOSIS — Z7409 Other reduced mobility: Secondary | ICD-10-CM | POA: Diagnosis not present

## 2021-09-07 DIAGNOSIS — K5900 Constipation, unspecified: Secondary | ICD-10-CM | POA: Diagnosis not present

## 2021-09-07 DIAGNOSIS — R5383 Other fatigue: Secondary | ICD-10-CM | POA: Diagnosis not present

## 2021-09-07 DIAGNOSIS — R5381 Other malaise: Secondary | ICD-10-CM | POA: Diagnosis not present

## 2021-09-07 DIAGNOSIS — E559 Vitamin D deficiency, unspecified: Secondary | ICD-10-CM | POA: Diagnosis not present

## 2021-09-07 DIAGNOSIS — Z1329 Encounter for screening for other suspected endocrine disorder: Secondary | ICD-10-CM | POA: Diagnosis not present

## 2021-09-07 DIAGNOSIS — N951 Menopausal and female climacteric states: Secondary | ICD-10-CM | POA: Diagnosis not present

## 2021-09-07 DIAGNOSIS — Z131 Encounter for screening for diabetes mellitus: Secondary | ICD-10-CM | POA: Diagnosis not present

## 2021-09-07 DIAGNOSIS — G4700 Insomnia, unspecified: Secondary | ICD-10-CM | POA: Diagnosis not present

## 2021-09-07 DIAGNOSIS — E785 Hyperlipidemia, unspecified: Secondary | ICD-10-CM | POA: Diagnosis not present

## 2021-09-07 DIAGNOSIS — M8580 Other specified disorders of bone density and structure, unspecified site: Secondary | ICD-10-CM | POA: Diagnosis not present

## 2021-09-09 DIAGNOSIS — M7051 Other bursitis of knee, right knee: Secondary | ICD-10-CM | POA: Diagnosis not present

## 2021-09-09 DIAGNOSIS — M7052 Other bursitis of knee, left knee: Secondary | ICD-10-CM | POA: Diagnosis not present

## 2021-10-04 DIAGNOSIS — F32A Depression, unspecified: Secondary | ICD-10-CM | POA: Diagnosis not present

## 2021-10-04 DIAGNOSIS — E7210 Disorders of sulfur-bearing amino-acid metabolism, unspecified: Secondary | ICD-10-CM | POA: Diagnosis not present

## 2021-10-04 DIAGNOSIS — E785 Hyperlipidemia, unspecified: Secondary | ICD-10-CM | POA: Diagnosis not present

## 2021-10-04 DIAGNOSIS — N951 Menopausal and female climacteric states: Secondary | ICD-10-CM | POA: Diagnosis not present

## 2021-10-11 DIAGNOSIS — U071 COVID-19: Secondary | ICD-10-CM | POA: Diagnosis not present

## 2022-03-14 DIAGNOSIS — E559 Vitamin D deficiency, unspecified: Secondary | ICD-10-CM | POA: Diagnosis not present

## 2022-03-14 DIAGNOSIS — G4700 Insomnia, unspecified: Secondary | ICD-10-CM | POA: Diagnosis not present

## 2022-03-14 DIAGNOSIS — E063 Autoimmune thyroiditis: Secondary | ICD-10-CM | POA: Diagnosis not present

## 2022-03-14 DIAGNOSIS — F32A Depression, unspecified: Secondary | ICD-10-CM | POA: Diagnosis not present

## 2022-03-14 DIAGNOSIS — R5383 Other fatigue: Secondary | ICD-10-CM | POA: Diagnosis not present

## 2022-03-14 DIAGNOSIS — E785 Hyperlipidemia, unspecified: Secondary | ICD-10-CM | POA: Diagnosis not present

## 2022-03-14 DIAGNOSIS — E7210 Disorders of sulfur-bearing amino-acid metabolism, unspecified: Secondary | ICD-10-CM | POA: Diagnosis not present

## 2022-03-14 DIAGNOSIS — N951 Menopausal and female climacteric states: Secondary | ICD-10-CM | POA: Diagnosis not present

## 2022-04-04 DIAGNOSIS — N951 Menopausal and female climacteric states: Secondary | ICD-10-CM | POA: Diagnosis not present

## 2022-04-04 DIAGNOSIS — E7210 Disorders of sulfur-bearing amino-acid metabolism, unspecified: Secondary | ICD-10-CM | POA: Diagnosis not present

## 2022-04-04 DIAGNOSIS — E785 Hyperlipidemia, unspecified: Secondary | ICD-10-CM | POA: Diagnosis not present

## 2022-04-04 DIAGNOSIS — R1310 Dysphagia, unspecified: Secondary | ICD-10-CM | POA: Diagnosis not present

## 2022-04-25 DIAGNOSIS — E0520 Thyrotoxicosis with toxic multinodular goiter without thyrotoxic crisis or storm: Secondary | ICD-10-CM | POA: Diagnosis not present

## 2022-05-25 ENCOUNTER — Other Ambulatory Visit: Payer: Self-pay | Admitting: Obstetrics and Gynecology

## 2022-05-25 DIAGNOSIS — Z1231 Encounter for screening mammogram for malignant neoplasm of breast: Secondary | ICD-10-CM

## 2022-06-22 ENCOUNTER — Other Ambulatory Visit: Payer: Self-pay | Admitting: Obstetrics and Gynecology

## 2022-06-22 ENCOUNTER — Ambulatory Visit
Admission: RE | Admit: 2022-06-22 | Discharge: 2022-06-22 | Disposition: A | Discharge status: Home / Self Care | Payer: Federal, State, Local not specified - PPO | Source: Ambulatory Visit | Attending: Obstetrics and Gynecology | Admitting: Obstetrics and Gynecology

## 2022-06-22 DIAGNOSIS — N644 Mastodynia: Secondary | ICD-10-CM

## 2022-06-22 DIAGNOSIS — Z1231 Encounter for screening mammogram for malignant neoplasm of breast: Secondary | ICD-10-CM

## 2022-07-07 ENCOUNTER — Ambulatory Visit
Admission: RE | Admit: 2022-07-07 | Discharge: 2022-07-07 | Disposition: A | Discharge status: Home / Self Care | Payer: Federal, State, Local not specified - PPO | Source: Ambulatory Visit | Attending: Obstetrics and Gynecology | Admitting: Obstetrics and Gynecology

## 2022-07-07 DIAGNOSIS — N644 Mastodynia: Secondary | ICD-10-CM | POA: Diagnosis not present

## 2022-08-10 DIAGNOSIS — L820 Inflamed seborrheic keratosis: Secondary | ICD-10-CM | POA: Diagnosis not present

## 2022-08-10 DIAGNOSIS — C4442 Squamous cell carcinoma of skin of scalp and neck: Secondary | ICD-10-CM | POA: Diagnosis not present

## 2022-08-10 DIAGNOSIS — Z08 Encounter for follow-up examination after completed treatment for malignant neoplasm: Secondary | ICD-10-CM | POA: Diagnosis not present

## 2022-08-10 DIAGNOSIS — D485 Neoplasm of uncertain behavior of skin: Secondary | ICD-10-CM | POA: Diagnosis not present

## 2022-08-10 DIAGNOSIS — L821 Other seborrheic keratosis: Secondary | ICD-10-CM | POA: Diagnosis not present

## 2022-08-10 DIAGNOSIS — Z85820 Personal history of malignant melanoma of skin: Secondary | ICD-10-CM | POA: Diagnosis not present

## 2022-08-10 DIAGNOSIS — C4331 Malignant melanoma of nose: Secondary | ICD-10-CM | POA: Diagnosis not present

## 2022-08-10 DIAGNOSIS — C44729 Squamous cell carcinoma of skin of left lower limb, including hip: Secondary | ICD-10-CM | POA: Diagnosis not present

## 2022-08-24 DIAGNOSIS — D0472 Carcinoma in situ of skin of left lower limb, including hip: Secondary | ICD-10-CM | POA: Diagnosis not present

## 2022-09-09 DIAGNOSIS — L578 Other skin changes due to chronic exposure to nonionizing radiation: Secondary | ICD-10-CM | POA: Diagnosis not present

## 2022-09-09 DIAGNOSIS — L905 Scar conditions and fibrosis of skin: Secondary | ICD-10-CM | POA: Diagnosis not present

## 2022-09-09 DIAGNOSIS — L570 Actinic keratosis: Secondary | ICD-10-CM | POA: Diagnosis not present

## 2022-09-09 DIAGNOSIS — C4442 Squamous cell carcinoma of skin of scalp and neck: Secondary | ICD-10-CM | POA: Diagnosis not present

## 2022-10-05 DIAGNOSIS — Z1389 Encounter for screening for other disorder: Secondary | ICD-10-CM | POA: Diagnosis not present

## 2022-10-05 DIAGNOSIS — Z01419 Encounter for gynecological examination (general) (routine) without abnormal findings: Secondary | ICD-10-CM | POA: Diagnosis not present

## 2022-10-05 DIAGNOSIS — G4700 Insomnia, unspecified: Secondary | ICD-10-CM | POA: Diagnosis not present

## 2022-11-29 DIAGNOSIS — F32A Depression, unspecified: Secondary | ICD-10-CM | POA: Diagnosis not present

## 2022-11-29 DIAGNOSIS — N951 Menopausal and female climacteric states: Secondary | ICD-10-CM | POA: Diagnosis not present

## 2022-11-29 DIAGNOSIS — E785 Hyperlipidemia, unspecified: Secondary | ICD-10-CM | POA: Diagnosis not present

## 2022-11-29 DIAGNOSIS — R5383 Other fatigue: Secondary | ICD-10-CM | POA: Diagnosis not present

## 2023-01-16 DIAGNOSIS — E785 Hyperlipidemia, unspecified: Secondary | ICD-10-CM | POA: Diagnosis not present

## 2023-01-16 DIAGNOSIS — E063 Autoimmune thyroiditis: Secondary | ICD-10-CM | POA: Diagnosis not present

## 2023-01-16 DIAGNOSIS — R5383 Other fatigue: Secondary | ICD-10-CM | POA: Diagnosis not present

## 2023-01-16 DIAGNOSIS — F32A Depression, unspecified: Secondary | ICD-10-CM | POA: Diagnosis not present

## 2023-01-16 DIAGNOSIS — E559 Vitamin D deficiency, unspecified: Secondary | ICD-10-CM | POA: Diagnosis not present

## 2023-01-16 DIAGNOSIS — N951 Menopausal and female climacteric states: Secondary | ICD-10-CM | POA: Diagnosis not present

## 2023-02-23 DIAGNOSIS — F32A Depression, unspecified: Secondary | ICD-10-CM | POA: Diagnosis not present

## 2023-02-23 DIAGNOSIS — N951 Menopausal and female climacteric states: Secondary | ICD-10-CM | POA: Diagnosis not present

## 2023-02-23 DIAGNOSIS — R5383 Other fatigue: Secondary | ICD-10-CM | POA: Diagnosis not present

## 2023-02-23 DIAGNOSIS — E785 Hyperlipidemia, unspecified: Secondary | ICD-10-CM | POA: Diagnosis not present

## 2023-02-28 DIAGNOSIS — L814 Other melanin hyperpigmentation: Secondary | ICD-10-CM | POA: Diagnosis not present

## 2023-02-28 DIAGNOSIS — Z08 Encounter for follow-up examination after completed treatment for malignant neoplasm: Secondary | ICD-10-CM | POA: Diagnosis not present

## 2023-02-28 DIAGNOSIS — D229 Melanocytic nevi, unspecified: Secondary | ICD-10-CM | POA: Diagnosis not present

## 2023-02-28 DIAGNOSIS — L821 Other seborrheic keratosis: Secondary | ICD-10-CM | POA: Diagnosis not present

## 2023-05-30 DIAGNOSIS — R1310 Dysphagia, unspecified: Secondary | ICD-10-CM | POA: Diagnosis not present

## 2023-05-30 DIAGNOSIS — E7210 Disorders of sulfur-bearing amino-acid metabolism, unspecified: Secondary | ICD-10-CM | POA: Diagnosis not present

## 2023-05-30 DIAGNOSIS — E063 Autoimmune thyroiditis: Secondary | ICD-10-CM | POA: Diagnosis not present

## 2023-05-30 DIAGNOSIS — E559 Vitamin D deficiency, unspecified: Secondary | ICD-10-CM | POA: Diagnosis not present

## 2023-05-30 DIAGNOSIS — E785 Hyperlipidemia, unspecified: Secondary | ICD-10-CM | POA: Diagnosis not present

## 2023-05-30 DIAGNOSIS — G4700 Insomnia, unspecified: Secondary | ICD-10-CM | POA: Diagnosis not present

## 2023-06-15 DIAGNOSIS — E785 Hyperlipidemia, unspecified: Secondary | ICD-10-CM | POA: Diagnosis not present

## 2023-06-15 DIAGNOSIS — R5383 Other fatigue: Secondary | ICD-10-CM | POA: Diagnosis not present

## 2023-06-15 DIAGNOSIS — F32A Depression, unspecified: Secondary | ICD-10-CM | POA: Diagnosis not present

## 2023-06-15 DIAGNOSIS — N951 Menopausal and female climacteric states: Secondary | ICD-10-CM | POA: Diagnosis not present

## 2023-07-10 ENCOUNTER — Other Ambulatory Visit: Payer: Self-pay | Admitting: Obstetrics and Gynecology

## 2023-07-10 DIAGNOSIS — Z1231 Encounter for screening mammogram for malignant neoplasm of breast: Secondary | ICD-10-CM

## 2023-08-10 ENCOUNTER — Ambulatory Visit
Admission: RE | Admit: 2023-08-10 | Discharge: 2023-08-10 | Disposition: A | Discharge status: Home / Self Care | Payer: Federal, State, Local not specified - PPO | Source: Ambulatory Visit | Attending: Obstetrics and Gynecology | Admitting: Obstetrics and Gynecology

## 2023-08-10 DIAGNOSIS — Z1231 Encounter for screening mammogram for malignant neoplasm of breast: Secondary | ICD-10-CM | POA: Diagnosis not present

## 2023-09-05 DIAGNOSIS — L814 Other melanin hyperpigmentation: Secondary | ICD-10-CM | POA: Diagnosis not present

## 2023-09-05 DIAGNOSIS — L570 Actinic keratosis: Secondary | ICD-10-CM | POA: Diagnosis not present

## 2023-09-05 DIAGNOSIS — Z85828 Personal history of other malignant neoplasm of skin: Secondary | ICD-10-CM | POA: Diagnosis not present

## 2023-09-05 DIAGNOSIS — Z08 Encounter for follow-up examination after completed treatment for malignant neoplasm: Secondary | ICD-10-CM | POA: Diagnosis not present

## 2023-09-05 DIAGNOSIS — L821 Other seborrheic keratosis: Secondary | ICD-10-CM | POA: Diagnosis not present

## 2023-09-05 DIAGNOSIS — D485 Neoplasm of uncertain behavior of skin: Secondary | ICD-10-CM | POA: Diagnosis not present

## 2023-09-14 DIAGNOSIS — L570 Actinic keratosis: Secondary | ICD-10-CM | POA: Diagnosis not present

## 2023-11-21 ENCOUNTER — Other Ambulatory Visit: Payer: Self-pay | Admitting: Obstetrics and Gynecology

## 2023-11-21 DIAGNOSIS — M8580 Other specified disorders of bone density and structure, unspecified site: Secondary | ICD-10-CM

## 2023-11-21 DIAGNOSIS — Z01419 Encounter for gynecological examination (general) (routine) without abnormal findings: Secondary | ICD-10-CM | POA: Diagnosis not present

## 2023-11-25 ENCOUNTER — Ambulatory Visit
Admission: RE | Admit: 2023-11-25 | Discharge: 2023-11-25 | Disposition: A | Discharge status: Home / Self Care | Payer: Federal, State, Local not specified - PPO | Source: Ambulatory Visit | Attending: Obstetrics and Gynecology | Admitting: Obstetrics and Gynecology

## 2023-11-25 DIAGNOSIS — N958 Other specified menopausal and perimenopausal disorders: Secondary | ICD-10-CM | POA: Diagnosis not present

## 2023-11-25 DIAGNOSIS — M8588 Other specified disorders of bone density and structure, other site: Secondary | ICD-10-CM | POA: Diagnosis not present

## 2023-11-25 DIAGNOSIS — M069 Rheumatoid arthritis, unspecified: Secondary | ICD-10-CM | POA: Diagnosis not present

## 2023-11-25 DIAGNOSIS — M8580 Other specified disorders of bone density and structure, unspecified site: Secondary | ICD-10-CM

## 2023-12-19 DIAGNOSIS — R7309 Other abnormal glucose: Secondary | ICD-10-CM | POA: Diagnosis not present

## 2023-12-19 DIAGNOSIS — E7800 Pure hypercholesterolemia, unspecified: Secondary | ICD-10-CM | POA: Diagnosis not present

## 2023-12-19 DIAGNOSIS — Z79899 Other long term (current) drug therapy: Secondary | ICD-10-CM | POA: Diagnosis not present

## 2023-12-19 DIAGNOSIS — E039 Hypothyroidism, unspecified: Secondary | ICD-10-CM | POA: Diagnosis not present

## 2023-12-20 DIAGNOSIS — R946 Abnormal results of thyroid function studies: Secondary | ICD-10-CM | POA: Diagnosis not present

## 2023-12-20 DIAGNOSIS — E7800 Pure hypercholesterolemia, unspecified: Secondary | ICD-10-CM | POA: Diagnosis not present

## 2023-12-20 DIAGNOSIS — R7309 Other abnormal glucose: Secondary | ICD-10-CM | POA: Diagnosis not present

## 2023-12-20 DIAGNOSIS — Z79899 Other long term (current) drug therapy: Secondary | ICD-10-CM | POA: Diagnosis not present

## 2024-01-04 DIAGNOSIS — N951 Menopausal and female climacteric states: Secondary | ICD-10-CM | POA: Diagnosis not present

## 2024-01-04 DIAGNOSIS — E7800 Pure hypercholesterolemia, unspecified: Secondary | ICD-10-CM | POA: Diagnosis not present

## 2024-01-04 DIAGNOSIS — Z0000 Encounter for general adult medical examination without abnormal findings: Secondary | ICD-10-CM | POA: Diagnosis not present

## 2024-01-04 DIAGNOSIS — F554 Abuse of vitamins: Secondary | ICD-10-CM | POA: Diagnosis not present

## 2024-01-04 DIAGNOSIS — E039 Hypothyroidism, unspecified: Secondary | ICD-10-CM | POA: Diagnosis not present

## 2024-01-16 DIAGNOSIS — J019 Acute sinusitis, unspecified: Secondary | ICD-10-CM | POA: Diagnosis not present

## 2024-03-07 DIAGNOSIS — L570 Actinic keratosis: Secondary | ICD-10-CM | POA: Diagnosis not present

## 2024-03-07 DIAGNOSIS — L578 Other skin changes due to chronic exposure to nonionizing radiation: Secondary | ICD-10-CM | POA: Diagnosis not present

## 2024-03-07 DIAGNOSIS — L821 Other seborrheic keratosis: Secondary | ICD-10-CM | POA: Diagnosis not present

## 2024-03-07 DIAGNOSIS — L814 Other melanin hyperpigmentation: Secondary | ICD-10-CM | POA: Diagnosis not present

## 2024-03-07 DIAGNOSIS — D1801 Hemangioma of skin and subcutaneous tissue: Secondary | ICD-10-CM | POA: Diagnosis not present

## 2024-03-26 DIAGNOSIS — G4700 Insomnia, unspecified: Secondary | ICD-10-CM | POA: Diagnosis not present

## 2024-03-26 DIAGNOSIS — E559 Vitamin D deficiency, unspecified: Secondary | ICD-10-CM | POA: Diagnosis not present

## 2024-03-26 DIAGNOSIS — E7210 Disorders of sulfur-bearing amino-acid metabolism, unspecified: Secondary | ICD-10-CM | POA: Diagnosis not present

## 2024-03-26 DIAGNOSIS — E785 Hyperlipidemia, unspecified: Secondary | ICD-10-CM | POA: Diagnosis not present

## 2024-03-26 DIAGNOSIS — E063 Autoimmune thyroiditis: Secondary | ICD-10-CM | POA: Diagnosis not present

## 2024-03-26 DIAGNOSIS — R1310 Dysphagia, unspecified: Secondary | ICD-10-CM | POA: Diagnosis not present

## 2024-04-04 DIAGNOSIS — F32A Depression, unspecified: Secondary | ICD-10-CM | POA: Diagnosis not present

## 2024-04-04 DIAGNOSIS — E785 Hyperlipidemia, unspecified: Secondary | ICD-10-CM | POA: Diagnosis not present

## 2024-04-04 DIAGNOSIS — N951 Menopausal and female climacteric states: Secondary | ICD-10-CM | POA: Diagnosis not present

## 2024-04-04 DIAGNOSIS — R5383 Other fatigue: Secondary | ICD-10-CM | POA: Diagnosis not present

## 2024-04-05 DIAGNOSIS — R197 Diarrhea, unspecified: Secondary | ICD-10-CM | POA: Diagnosis not present

## 2024-04-08 DIAGNOSIS — N951 Menopausal and female climacteric states: Secondary | ICD-10-CM | POA: Diagnosis not present

## 2024-04-08 DIAGNOSIS — E039 Hypothyroidism, unspecified: Secondary | ICD-10-CM | POA: Diagnosis not present

## 2024-07-31 ENCOUNTER — Other Ambulatory Visit: Payer: Self-pay | Admitting: Obstetrics and Gynecology

## 2024-07-31 DIAGNOSIS — Z1231 Encounter for screening mammogram for malignant neoplasm of breast: Secondary | ICD-10-CM

## 2024-08-13 ENCOUNTER — Ambulatory Visit
Admission: RE | Admit: 2024-08-13 | Discharge: 2024-08-13 | Disposition: A | Discharge status: Home / Self Care | Source: Ambulatory Visit | Attending: Obstetrics and Gynecology | Admitting: Obstetrics and Gynecology

## 2024-08-13 DIAGNOSIS — Z1231 Encounter for screening mammogram for malignant neoplasm of breast: Secondary | ICD-10-CM

## 2024-09-12 DIAGNOSIS — R208 Other disturbances of skin sensation: Secondary | ICD-10-CM | POA: Diagnosis not present

## 2024-09-12 DIAGNOSIS — L814 Other melanin hyperpigmentation: Secondary | ICD-10-CM | POA: Diagnosis not present

## 2024-09-12 DIAGNOSIS — L820 Inflamed seborrheic keratosis: Secondary | ICD-10-CM | POA: Diagnosis not present

## 2024-09-12 DIAGNOSIS — D1801 Hemangioma of skin and subcutaneous tissue: Secondary | ICD-10-CM | POA: Diagnosis not present

## 2024-09-12 DIAGNOSIS — L578 Other skin changes due to chronic exposure to nonionizing radiation: Secondary | ICD-10-CM | POA: Diagnosis not present

## 2024-09-12 DIAGNOSIS — L570 Actinic keratosis: Secondary | ICD-10-CM | POA: Diagnosis not present

## 2024-09-12 DIAGNOSIS — L821 Other seborrheic keratosis: Secondary | ICD-10-CM | POA: Diagnosis not present

## 2024-09-12 DIAGNOSIS — L538 Other specified erythematous conditions: Secondary | ICD-10-CM | POA: Diagnosis not present

## 2024-09-25 DIAGNOSIS — E063 Autoimmune thyroiditis: Secondary | ICD-10-CM | POA: Diagnosis not present

## 2024-09-25 DIAGNOSIS — R5383 Other fatigue: Secondary | ICD-10-CM | POA: Diagnosis not present

## 2024-09-25 DIAGNOSIS — N951 Menopausal and female climacteric states: Secondary | ICD-10-CM | POA: Diagnosis not present

## 2024-10-11 DIAGNOSIS — R051 Acute cough: Secondary | ICD-10-CM | POA: Diagnosis not present

## 2024-10-11 DIAGNOSIS — J189 Pneumonia, unspecified organism: Secondary | ICD-10-CM | POA: Diagnosis not present

## 2024-10-11 DIAGNOSIS — Z20822 Contact with and (suspected) exposure to covid-19: Secondary | ICD-10-CM | POA: Diagnosis not present
